# Patient Record
Sex: Male | Born: 1970 | Race: White | Hispanic: No | Marital: Married | Smoking: Never smoker
Health system: Southern US, Community
[De-identification: ages and names within clinical notes are randomized; demographics above are authoritative.]

## PROBLEM LIST (undated history)

## (undated) DIAGNOSIS — D72819 Decreased white blood cell count, unspecified: Secondary | ICD-10-CM

## (undated) DIAGNOSIS — K219 Gastro-esophageal reflux disease without esophagitis: Secondary | ICD-10-CM

## (undated) HISTORY — DX: Decreased white blood cell count, unspecified: D72.819

## (undated) HISTORY — DX: Gastro-esophageal reflux disease without esophagitis: K21.9

---

## 2002-02-12 ENCOUNTER — Encounter: Payer: Self-pay | Admitting: Family Medicine

## 2002-02-12 ENCOUNTER — Ambulatory Visit (HOSPITAL_COMMUNITY): Admission: RE | Admit: 2002-02-12 | Discharge: 2002-02-12 | Payer: Self-pay | Admitting: Family Medicine

## 2008-10-28 ENCOUNTER — Ambulatory Visit (HOSPITAL_COMMUNITY): Admission: RE | Admit: 2008-10-28 | Discharge: 2008-10-28 | Payer: Self-pay | Admitting: Orthopaedic Surgery

## 2008-10-30 ENCOUNTER — Ambulatory Visit (HOSPITAL_COMMUNITY): Admission: RE | Admit: 2008-10-30 | Discharge: 2008-10-30 | Payer: Self-pay | Admitting: Orthopaedic Surgery

## 2008-10-30 ENCOUNTER — Encounter (INDEPENDENT_AMBULATORY_CARE_PROVIDER_SITE_OTHER): Payer: Self-pay | Admitting: Orthopaedic Surgery

## 2010-06-20 ENCOUNTER — Encounter: Payer: Self-pay | Admitting: Orthopaedic Surgery

## 2010-09-05 LAB — CBC
HCT: 41.8 % (ref 39.0–52.0)
Hemoglobin: 14.3 g/dL (ref 13.0–17.0)
Platelets: 186 10*3/uL (ref 150–400)
RBC: 4.34 MIL/uL (ref 4.22–5.81)
WBC: 4.6 10*3/uL (ref 4.0–10.5)

## 2010-10-11 NOTE — Op Note (Signed)
NAME:  Isaiah Collins, Isaiah Collins                 ACCOUNT NO.:  [id number]   MEDICAL RECORD NO.:  [id number]          PATIENT TYPE:  AMB   LOCATION:  SDS                          FACILITY:  [facility]   PHYSICIAN:  Vanita Panda. Magnus Ivan, M.D.DATE OF BIRTH:  12-Mar-1971   DATE OF PROCEDURE:  10/30/2008  DATE OF DISCHARGE:  10/30/2008                               OPERATIVE REPORT   PREOPERATIVE DIAGNOSIS:  Right arm masses x2 and right axillary mass.   POSTOPERATIVE DIAGNOSIS:  Right arm masses x2 and right axillary mass.   PROCEDURE:  Excision of right arm mass x2 through two separate  incisions.   SURGEON:  Vanita Panda. Magnus Ivan, MD   ANESTHESIA:  General.   ANTIBIOTICS:  1 g IV Ancef.   BLOOD LOSS:  Less than 100 mL.   COMPLICATIONS:  None.   FINDINGS:  1. Two specimens from right arm sent to Pathology for permanent      identification.  2. Frozen section sent from OR consistent with lymphoid type of tissue      with neutrophils.   INDICATIONS:  Briefly, Isaiah Collins is a 40 year old right-hand-dominant male  who had developed 2 palpable masses and it did became painful in his  right arm over the last 2 weeks.  He also had a palpable mass in his  axilla and all these were painful.  An MRI was obtained of just the arm  with these being down toward the elbow and just proximal to the elbow.  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  I given the MRI  findings recommended he undergo exploration of the masses in his arm  with removal of these for pathologic identification.  I talked him about  the possibility of removing the axillary mass  depending on what my  findings are in the arm.  The risks and benefits of this were explained  in length, and he wished to proceed with surgery.   PROCEDURE IN DETAIL:  After informed consent was obtained, appropriate  right arm was marked.  He was brought to the operating room, placed  supine on the operating table.  General anesthesia was then obtained.  I  then had his axillary area shaved and then it was prepped from the  axilla and chest all the way down to the wrist with DuraPrep and sterile  drapes were applied including sterile stockinette.  A time-out was  called to identify the correct patient and correct right arm.  I then  made an incision directly over the smaller of the masses at the distal  third of the humerus.  I dissected down the soft tissues and easily  identified a large nerve coming right by this palpable mass.  It  was  difficult to tone, I followed this, it came off of a branch from the  nerve but it also seemed more consistent with the lymphatic chain.  I  was able to excise this mass easily in its entirety and I put it aside  for permanent identification.  I then copiously irrigated this wound and  packed it with dressing and moved down more distal to the elbow.  The  next mass was on the anterior medial elbow, I was able to dissect over  this and dissect down.  I found the tissue bunched up more in this area.  I could identify the musculocutaneous and median nerves in this area.  This was all superficial to the ulnar nerve.  However, this mass was not  as formed or is not as encapsulated as the other mass and it had  necrotic central portions.  I immediately sent these portions to  Pathology for frozen section.  The pathologist called during case and  said that he felt these findings were consistent with lymphocytes as  well as neutrophils.  This points to a potential inflammatory process or  infectious process or even a lymphoma.  I then was able to completely   dissect out this next mass in its entirety, and I passed this off the  table as well for permanent identification.  Given the fact that these  were likely lymphatic in nature, I decided to not pursue dissection and  exploration of the axillary mass.  This seemed to be more consistent  with a lymph node as well.  I then copiously irrigated again the tissue  in both wounds and closed the superficial tissue with interrupted 2-0  Vicryl suture and the skin with interrupted 3-0 nylon suture.  The  incisions were infiltrated with 0.25% plain Marcaine and then Xeroform  followed by a well-padded sterile dressing was applied.  The patient was  awakened, extubated, and taken to recovery room in stable condition.  Postoperatively, I will have him keep this incision clean for the next  few days and then we will wait the final pathology results.      Vanita Panda. Magnus Ivan, M.D.  Electronically Signed     CYB/MEDQ  D:  10/30/2008  T:  10/31/2008  Job:  [id number]

## 2014-06-26 ENCOUNTER — Telehealth: Payer: Self-pay | Admitting: Hematology

## 2014-06-26 NOTE — Telephone Encounter (Signed)
CALLED PATIENT TO SCHEDULE NP APPT PER PATIENT WILL CALL BACK TO SCHEDULE.

## 2014-07-15 ENCOUNTER — Telehealth: Payer: Self-pay | Admitting: Hematology and Oncology

## 2014-07-15 NOTE — Telephone Encounter (Signed)
pt confirmed appt on 08/04/14 w/Gorsuch Dx: tender neck adenopathy- intermittent, longstanding Referring Dr. Dulce Sellarutlaw [facility])

## 2014-08-04 ENCOUNTER — Ambulatory Visit: Payer: 59

## 2014-08-04 ENCOUNTER — Encounter: Payer: Self-pay | Admitting: Hematology and Oncology

## 2014-08-04 ENCOUNTER — Encounter (INDEPENDENT_AMBULATORY_CARE_PROVIDER_SITE_OTHER): Payer: Self-pay

## 2014-08-04 ENCOUNTER — Ambulatory Visit (HOSPITAL_BASED_OUTPATIENT_CLINIC_OR_DEPARTMENT_OTHER): Payer: 59 | Admitting: Hematology and Oncology

## 2014-08-04 VITALS — BP 141/66 | HR 79 | Temp 98.3°F | Resp 20 | Ht 75.0 in | Wt 184.5 lb

## 2014-08-04 DIAGNOSIS — D7589 Other specified diseases of blood and blood-forming organs: Secondary | ICD-10-CM

## 2014-08-04 DIAGNOSIS — D72819 Decreased white blood cell count, unspecified: Secondary | ICD-10-CM

## 2014-08-04 DIAGNOSIS — R194 Change in bowel habit: Secondary | ICD-10-CM

## 2014-08-04 NOTE — Progress Notes (Signed)
Checked in new pt with no financial concerns at this time.  Pt has Raquel's card for any billing questions or concerns. ° °

## 2014-08-05 DIAGNOSIS — D7589 Other specified diseases of blood and blood-forming organs: Secondary | ICD-10-CM | POA: Insufficient documentation

## 2014-08-05 DIAGNOSIS — R194 Change in bowel habit: Secondary | ICD-10-CM | POA: Insufficient documentation

## 2014-08-05 DIAGNOSIS — D72819 Decreased white blood cell count, unspecified: Secondary | ICD-10-CM | POA: Insufficient documentation

## 2014-08-05 NOTE — Assessment & Plan Note (Signed)
He has mild macrocytosis without anemia. I suspect this is related to his alcohol intake. I reassured the patient.

## 2014-08-05 NOTE — Assessment & Plan Note (Signed)
He had tender lymphadenopathy of month or 2 ago, subsequently resolved. He was mild leukopenia which has improved on recent blood work. Overall, I do not believe this is related to malignancy. It is very likely the patient may have contracted viral illness over the past few months and overall he is improving. I recommend he have repeat blood work with his PCP within the next 2-3 months and have the results faxed to me. At present time, the patient is comfortable not to pursue additional workup.

## 2014-08-05 NOTE — Assessment & Plan Note (Signed)
This is of concern. Again, I told the patient bowel habit could change after viral illness. He has appointment set up in the near future to see a gastroenterologist for EGD and colonoscopy. I would defer to them for further management. I recommend the patient to avoid dairy products if possible.

## 2014-08-05 NOTE — Progress Notes (Signed)
[facility] CONSULT NOTE  Patient Care Team: No Pcp Per Patient as PCP - General (General Practice)  CHIEF COMPLAINTS/PURPOSE OF CONSULTATION:  Tender lymphadenopathy, mild leukopenia  HISTORY OF PRESENTING ILLNESS:  Isaiah Collins 44 y.o. male is here because of recent tender lymphadenopathy and mild leukopenia. I reviewed the last few blood test dated back from 2013 and noted some mild leukopenia and macrocytosis without anemia. The patient started to have weird sensation around the left cervical region starting around October 2015. He had tender lymphadenopathy which has resolved. He also complained of some jaw pain and earache. He has 2 young children at home and they were sick around the time. Subsequently, he started to have change in bowel habits with constipation alternating with diarrhea with associated gas pain. He had one episode of hematochezia 3 weeks ago. He has mild symptoms of reflux sensation on and off. He denies recent weight loss.   MEDICAL HISTORY:  Past Medical History  Diagnosis Date  . GERD (gastroesophageal reflux disease)   . Leukopenia     SURGICAL HISTORY: History reviewed. No pertinent past surgical history.  SOCIAL HISTORY: History   Social History  . Marital Status: Single    Spouse Name: N/A  . Number of Children: N/A  . Years of Education: N/A   Occupational History  . Not on file.   Social History Main Topics  . Smoking status: Never Smoker   . Smokeless tobacco: Never Used  . Alcohol Use: Yes     Comment: wine - 2-3 glasses each night  . Drug Use: No  . Sexual Activity: Not on file   Other Topics Concern  . Not on file   Social History Narrative  . No narrative on file    FAMILY HISTORY: History reviewed. No pertinent family history.  ALLERGIES:  has No Known Allergies.  MEDICATIONS:  Current Outpatient Prescriptions  Medication Sig Dispense Refill  . esomeprazole (NEXIUM) 20 MG capsule Take 20 mg by mouth  daily.     . fluticasone (FLONASE) 50 MCG/ACT nasal spray   1   No current facility-administered medications for this visit.    REVIEW OF SYSTEMS:   Constitutional: Denies fevers, chills or abnormal night sweats Eyes: Denies blurriness of vision, double vision or watery eyes Ears, nose, mouth, throat, and face: Denies mucositis or sore throat Respiratory: Denies cough, dyspnea or wheezes Cardiovascular: Denies palpitation, chest discomfort or lower extremity swelling Skin: Denies abnormal skin rashes Neurological:Denies numbness, tingling or new weaknesses Behavioral/Psych: Mood is stable, no new changes  All other systems were reviewed with the patient and are negative.  PHYSICAL EXAMINATION: ECOG PERFORMANCE STATUS: 0 - Asymptomatic  Filed Vitals:   08/04/14 1415  BP: 141/66  Pulse: 79  Temp: 98.3 F (36.8 C)  Resp: 20   Filed Weights   08/04/14 1415  Weight: 184 lb 8 oz (83.689 kg)    GENERAL:alert, no distress and comfortable SKIN: skin color, texture, turgor are normal, no rashes or significant lesions EYES: normal, conjunctiva are pink and non-injected, sclera clear OROPHARYNX:no exudate, no erythema and lips, buccal mucosa, and tongue normal  NECK: supple, thyroid normal size, non-tender, without nodularity LYMPH:  no palpable lymphadenopathy in the cervical, axillary or inguinal LUNGS: clear to auscultation and percussion with normal breathing effort HEART: regular rate & rhythm and no murmurs and no lower extremity edema ABDOMEN:abdomen soft, non-tender and normal bowel sounds Musculoskeletal:no cyanosis of digits and no clubbing  PSYCH: alert & oriented x  3 with fluent speech NEURO: no focal motor/sensory deficits  LABORATORY DATA:  I have reviewed the data as listed Lab Results  Component Value Date   WBC 4.6 10/30/2008   HGB 14.3 10/30/2008   HCT 41.8 10/30/2008   MCV 96.4 10/30/2008   PLT 186 10/30/2008   No results for input(s): NA, K, CL, CO2,  GLUCOSE, BUN, CREATININE, CALCIUM, GFRNONAA, GFRAA, PROT, ALBUMIN, AST, ALT, ALKPHOS, BILITOT, BILIDIR, IBILI in the last 8760 hours. ASSESSMENT & PLAN:  Chronic leukopenia He had tender lymphadenopathy of month or 2 ago, subsequently resolved. He was mild leukopenia which has improved on recent blood work. Overall, I do not believe this is related to malignancy. It is very likely the patient may have contracted viral illness over the past few months and overall he is improving. I recommend he have repeat blood work with his PCP within the next 2-3 months and have the results faxed to me. At present time, the patient is comfortable not to pursue additional workup.   Change in bowel habits This is of concern. Again, I told the patient bowel habit could change after viral illness. He has appointment set up in the near future to see a gastroenterologist for EGD and colonoscopy. I would defer to them for further management. I recommend the patient to avoid dairy products if possible.   Macrocytosis without anemia He has mild macrocytosis without anemia. I suspect this is related to his alcohol intake. I reassured the patient.      All questions were answered. The patient knows to call the clinic with any problems, questions or concerns. I spent 30 minutes counseling the patient face to face. The total time spent in the appointment was 40 minutes and more than 50% was on counseling.     [facility] Hospital[facility]ORSUCH, Brennyn Haisley, MD 08/05/2014 9:31 AM

## 2016-01-30 ENCOUNTER — Emergency Department (HOSPITAL_COMMUNITY): Payer: 59

## 2016-01-30 ENCOUNTER — Emergency Department (HOSPITAL_COMMUNITY)
Admission: EM | Admit: 2016-01-30 | Discharge: 2016-01-30 | Disposition: A | Discharge status: Home / Self Care | Payer: 59 | Attending: Emergency Medicine | Admitting: Emergency Medicine

## 2016-01-30 DIAGNOSIS — R112 Nausea with vomiting, unspecified: Secondary | ICD-10-CM | POA: Diagnosis not present

## 2016-01-30 DIAGNOSIS — R197 Diarrhea, unspecified: Secondary | ICD-10-CM | POA: Diagnosis not present

## 2016-01-30 DIAGNOSIS — R1013 Epigastric pain: Secondary | ICD-10-CM | POA: Insufficient documentation

## 2016-01-30 DIAGNOSIS — Z79899 Other long term (current) drug therapy: Secondary | ICD-10-CM | POA: Insufficient documentation

## 2016-01-30 DIAGNOSIS — R1012 Left upper quadrant pain: Secondary | ICD-10-CM | POA: Diagnosis present

## 2016-01-30 LAB — I-STAT CG4 LACTIC ACID, ED: LACTIC ACID, VENOUS: 0.73 mmol/L (ref 0.5–1.9)

## 2016-01-30 LAB — CBC
HCT: 40.9 % (ref 39.0–52.0)
Hemoglobin: 14.6 g/dL (ref 13.0–17.0)
MCH: 32.8 pg (ref 26.0–34.0)
MCHC: 35.7 g/dL (ref 30.0–36.0)
MCV: 91.9 fL (ref 78.0–100.0)
Platelets: 172 10*3/uL (ref 150–400)
RBC: 4.45 MIL/uL (ref 4.22–5.81)
RDW: 12.8 % (ref 11.5–15.5)
WBC: 5.8 10*3/uL (ref 4.0–10.5)

## 2016-01-30 LAB — URINALYSIS, ROUTINE W REFLEX MICROSCOPIC
BILIRUBIN URINE: NEGATIVE
Glucose, UA: NEGATIVE mg/dL
Hgb urine dipstick: NEGATIVE
KETONES UR: 15 mg/dL — AB
LEUKOCYTES UA: NEGATIVE
NITRITE: NEGATIVE
PROTEIN: NEGATIVE mg/dL
Specific Gravity, Urine: 1.018 (ref 1.005–1.030)
pH: 5.5 (ref 5.0–8.0)

## 2016-01-30 LAB — COMPREHENSIVE METABOLIC PANEL
ALBUMIN: 4.8 g/dL (ref 3.5–5.0)
ALK PHOS: 28 U/L — AB (ref 38–126)
ALT: 42 U/L (ref 17–63)
ANION GAP: 8 (ref 5–15)
AST: 28 U/L (ref 15–41)
BILIRUBIN TOTAL: 0.7 mg/dL (ref 0.3–1.2)
BUN: 22 mg/dL — AB (ref 6–20)
CALCIUM: 9.5 mg/dL (ref 8.9–10.3)
CO2: 30 mmol/L (ref 22–32)
Chloride: 103 mmol/L (ref 101–111)
Creatinine, Ser: 0.87 mg/dL (ref 0.61–1.24)
GFR calc Af Amer: >60 mL/min (ref >60)
GLUCOSE: 98 mg/dL (ref 65–99)
POTASSIUM: 4 mmol/L (ref 3.5–5.1)
Sodium: 141 mmol/L (ref 135–145)
TOTAL PROTEIN: 6.9 g/dL (ref 6.5–8.1)

## 2016-01-30 LAB — LIPASE, BLOOD: Lipase: 22 U/L (ref 11–51)

## 2016-01-30 MED ORDER — METRONIDAZOLE 500 MG PO TABS
500.0000 mg | ORAL_TABLET | Freq: Once | ORAL | Status: AC
  Administered 2016-01-30: 500 mg via ORAL
  Filled 2016-01-30: qty 1

## 2016-01-30 MED ORDER — DICYCLOMINE HCL 10 MG PO CAPS
10.0000 mg | ORAL_CAPSULE | Freq: Once | ORAL | Status: AC
  Administered 2016-01-30: 10 mg via ORAL
  Filled 2016-01-30: qty 1

## 2016-01-30 MED ORDER — IOPAMIDOL (ISOVUE-300) INJECTION 61%
25.0000 mL | Freq: Once | INTRAVENOUS | Status: AC | PRN
  Administered 2016-01-30: 30 mL via ORAL

## 2016-01-30 MED ORDER — ACETAMINOPHEN 325 MG PO TABS
325.0000 mg | ORAL_TABLET | Freq: Once | ORAL | Status: AC
  Administered 2016-01-30: 325 mg via ORAL
  Filled 2016-01-30: qty 1

## 2016-01-30 MED ORDER — AZITHROMYCIN 500 MG PO TABS: 500.0000 mg | ORAL_TABLET | Freq: Every day | ORAL | 0 refills | Status: DC

## 2016-01-30 MED ORDER — METRONIDAZOLE 500 MG PO TABS: 500.0000 mg | ORAL_TABLET | Freq: Two times a day (BID) | ORAL | 0 refills | Status: DC

## 2016-01-30 MED ORDER — ACETAMINOPHEN 325 MG PO TABS
650.0000 mg | ORAL_TABLET | Freq: Once | ORAL | Status: AC
  Administered 2016-01-30: 650 mg via ORAL
  Filled 2016-01-30: qty 2

## 2016-01-30 MED ORDER — IOPAMIDOL (ISOVUE-300) INJECTION 61%
100.0000 mL | Freq: Once | INTRAVENOUS | Status: AC | PRN
  Administered 2016-01-30: 100 mL via INTRAVENOUS

## 2016-01-30 MED ORDER — SODIUM CHLORIDE 0.9 % IV BOLUS (SEPSIS)
1000.0000 mL | Freq: Once | INTRAVENOUS | Status: AC
  Administered 2016-01-30: 1000 mL via INTRAVENOUS

## 2016-01-30 MED ORDER — ONDANSETRON HCL 4 MG PO TABS: 4.0000 mg | ORAL_TABLET | Freq: Four times a day (QID) | ORAL | 0 refills | Status: DC

## 2016-01-30 MED ORDER — ONDANSETRON HCL 4 MG/2ML IJ SOLN
4.0000 mg | Freq: Once | INTRAMUSCULAR | Status: AC
  Administered 2016-01-30: 4 mg via INTRAVENOUS
  Filled 2016-01-30: qty 2

## 2016-01-30 MED ORDER — GI COCKTAIL ~~LOC~~
30.0000 mL | Freq: Once | ORAL | Status: AC
Start: 2016-01-30 — End: 2016-01-30
  Administered 2016-01-30: 30 mL via ORAL
  Filled 2016-01-30: qty 30

## 2016-01-30 NOTE — ED Triage Notes (Addendum)
Pt states that he has been on a strict diet x 3 weeks trying to fix his small bowel bacteria issues but today his abdominal pain has become unbearable. Pt also states that he was bitten by 3-5 ticks this year, the last being 2 days ago. Alert and oriented. Nausea.

## 2016-01-30 NOTE — Discharge Instructions (Signed)
Take the flagyl and azithromycin as prescribed. May take the zofran as needed for nausea. Follow with your PCP if symptoms worsen. Follow up with GI and have your colonoscopy/endoscopy scheduled for Oct. Do not consume alcohol while taking the flagyl.

## 2016-01-30 NOTE — ED Provider Notes (Signed)
[facility]-EMERGENCY DEPT Provider Note   CSN: [id number] Arrival date & time: 01/30/16  1513     History   Chief Complaint Chief Complaint  Patient presents with  . Abdominal Pain    HPI Isaiah Collins is a 45 y.o. male.  45 year old Caucasian male no significant past medical history presents to the ED this afternoon with abdominal pain. Patient states the pain is located in his left upper abdomen and epigastric area. Describes the pain as cramping. This is a chronic issue. Started approximately 2 years ago. However the severity increased today. Which is why he presented to ED. Nothing makes better or worse. Has tried nothing for the pain. Patient was seen by a naturopathic provided and started a strict natural diet. He is followed up with GI who has a colonoscopy anf endoscopy scheduled for the beginning of October. Patient also endorses being bloated. Excessive burping. Endorses loose bowel habits. Was seen by primary care doctor August 30.  Patient also endorses being bitten by 3-5 ticks this year. Denies any rashes, fevers. Denies any fever, chills, headache, vision changes, emesis, chest pain, shortness of breath, urinary symptoms, hematochezia, melena,. Patient states abdominal pain is worse after bowel movement.  Nothing makes better or worse. He has tried nothing for the pain. Endorses mild nausea no emesis. Denies any family history of of auto immune disorders. Patient states they drink well water.      Past Medical History:  Diagnosis Date  . GERD (gastroesophageal reflux disease)   . Leukopenia     Patient Active Problem List   Diagnosis Date Noted  . Chronic leukopenia 08/05/2014  . Macrocytosis without anemia 08/05/2014  . Change in bowel habits 08/05/2014    No past surgical history on file.     Home Medications    Prior to Admission medications   Medication Sig Start Date End Date Taking? Authorizing Provider  esomeprazole (NEXIUM) 20 MG capsule Take 20 mg by  mouth daily.  06/14/14 06/14/15  Historical Provider, MD  fluticasone Aleda Grana) 50 MCG/ACT nasal spray  06/14/14   Historical Provider, MD    Family History No family history on file.  Social History Social History  Substance Use Topics  . Smoking status: Never Smoker  . Smokeless tobacco: Never Used  . Alcohol use Yes     Comment: wine - 2-3 glasses each night     Allergies   Review of patient's allergies indicates no known allergies.   Review of Systems Review of Systems  Constitutional: Positive for appetite change. Negative for chills, fever and unexpected weight change.  HENT: Negative for congestion, ear pain, rhinorrhea and sore throat.   Eyes: Negative for pain and discharge.  Respiratory: Negative for cough and shortness of breath.   Cardiovascular: Negative for chest pain and palpitations.  Gastrointestinal: Negative for abdominal distention, abdominal pain, blood in stool, constipation, diarrhea, nausea and vomiting.  Genitourinary: Negative for dysuria, flank pain, frequency, hematuria and urgency.  Musculoskeletal: Negative for arthralgias, myalgias and neck pain.  Skin: Negative for color change and rash.  Neurological: Negative for dizziness, syncope, weakness, light-headedness, numbness and headaches.  All other systems reviewed and are negative.    Physical Exam Updated Vital Signs BP 117/74 (BP Location: Left Arm)   Pulse 91   Temp 99.5 F (37.5 C) (Oral)   Resp 18   SpO2 100%   Physical Exam  Constitutional: He appears well-developed and well-nourished. No distress.  HENT:  Head: Normocephalic and atraumatic.  Mouth/Throat: Oropharynx  is clear and moist.  Eyes: Conjunctivae are normal. Right eye exhibits no discharge. Left eye exhibits no discharge. No scleral icterus.  Neck: Normal range of motion. Neck supple. No thyromegaly present.  Cardiovascular: Regular rhythm, normal heart sounds and intact distal pulses.  Tachycardia present.     Pulmonary/Chest: Effort normal and breath sounds normal. No respiratory distress.  Abdominal: Soft. Bowel sounds are normal. He exhibits no distension. There is generalized tenderness and tenderness in the epigastric area and left upper quadrant. There is no rigidity, no rebound, no guarding, no CVA tenderness, no tenderness at McBurney's point and negative Murphy's sign.  Musculoskeletal: Normal range of motion.  Lymphadenopathy:    He has no cervical adenopathy.  Neurological: He is alert.  Skin: Skin is warm and dry. Capillary refill takes less than 2 seconds. No rash noted. No pallor.  Vitals reviewed.    ED Treatments / Results  Labs (all labs ordered are listed, but only abnormal results are displayed) Labs Reviewed  COMPREHENSIVE METABOLIC PANEL - Abnormal; Notable for the following:       Result Value   BUN 22 (*)    Alkaline Phosphatase 28 (*)    All other components within normal limits  URINALYSIS, ROUTINE W REFLEX MICROSCOPIC (NOT AT [facility]) - Abnormal; Notable for the following:    Ketones, ur 15 (*)    All other components within normal limits  LIPASE, BLOOD  CBC  I-STAT CG4 LACTIC ACID, ED    EKG  EKG Interpretation None       Radiology Ct Abdomen Pelvis W Contrast  Result Date: 01/30/2016 CLINICAL DATA:  45 year old male presenting with abdominal pain. EXAM: CT ABDOMEN AND PELVIS WITH CONTRAST TECHNIQUE: Multidetector CT imaging of the abdomen and pelvis was performed using the standard protocol following bolus administration of intravenous contrast. CONTRAST:  ISOVUE-300 IOPAMIDOL (ISOVUE-300) INJECTION 61%, 25mL ISOVUE-300 IOPAMIDOL (ISOVUE-300) INJECTION 61% COMPARISON:  Lumbar spine radiograph dated 02/02/2012 FINDINGS: The visualized lung bases are clear. No intra-abdominal free air. Trace free fluid within the pelvis. The liver, gallbladder, pancreas, spleen, adrenal glands, kidneys, visualized ureters, and urinary bladder appear unremarkable. The  prostate and seminal vesicles grossly unremarkable. Evaluation of the bowel is limited in the absence of oral contrast. There is no evidence of bowel obstruction. The stomach is distended with gastric contents without definite evidence of mechanical gastric outlet obstruction. Clinical correlation is recommended to evaluate for gastroparesis. Multiple nondilated fluid-filled loops of small bowel noted. Enteritis is not excluded. Correlation with clinical exam recommended. The distal colon is collapsed. The appendix appears unremarkable. The abdominal aorta and IVC appear unremarkable. The origins of the celiac axis, SMA, IMA as well as the origins of the renal arteries are patent. The splenic vein, SMV, and main portal vein are patent. No portal venous gas identified. There is no adenopathy. The abdominal wall soft tissues appear unremarkable. The osseous structures are intact. IMPRESSION: Distended stomach without definite evidence of mechanical gastric outlet obstruction. Gastroparesis is not excluded. Nondistended fluid-filled loops of small bowel with mild haziness of the bowel wall concerning for enteritis. Clinical correlation is recommended. No evidence of small-bowel obstruction. Normal appendix. Electronically Signed   By: Elgie Collard M.D.   On: 01/30/2016 20:03    Procedures Procedures (including critical care time)  Medications Ordered in ED Medications  ondansetron (ZOFRAN) injection 4 mg (not administered)  gi cocktail (Maalox,Lidocaine,Donnatal) (not administered)     Initial Impression / Assessment and Plan / ED Course  I have  reviewed the triage vital signs and the nursing notes.  Pertinent labs & imaging results that were available during my care of the patient were reviewed by me and considered in my medical decision making (see chart for details).  Clinical Course  Patient presents to the ED with epigastric pain and bloating that has been ongoing for 2 years. Labs were  unremarkable. Patient was mildly tachycardic in ED. 1L of fluid bolus given. HR improved. Exam showed mild epigastric discomfort. No signs of peritonitis. CT scan showed dilated stomach without evidence of obstruction. It also noted he had enteritis.No signs of appendicitis or cholecystitis.  Patient started to develop a fever while in the ED. Lactic acid was done and normal. Another liter of IV fluids given. Tachycardia improved. Tylenol given for fever. Fever improved on discharge. Patient treated for possible bacterial enteritis with azithromycin due to patient having cipro allergy. Patient states he drinks well water and reports diarrhea. Will treat with flagyl for possible Giardia infection. Zofran given for nausea. Gi cocktail given. Patient states improvement with epigastric pain. Encouraged patient to follow up for coloscopy and endoscopy scheduled for beginning of October. Follow up with PCP if symptoms worsen. And patient encouraged to follow up with GI. Patient verbalized understanding with plan of care. Dr. Wynona Mealsarbana saw and examined patient and agrees with plan of care. Hemodynamically stable. Discharged home in NAD with stable vs and strict return precautions.    Final Clinical Impressions(s) / ED Diagnoses   Final diagnoses:  Epigastric pain  Nausea and vomiting, vomiting of unspecified type  Diarrhea, unspecified type    New Prescriptions Discharge Medication List as of 01/30/2016 10:33 PM    START taking these medications   Details  azithromycin (ZITHROMAX) 500 MG tablet Take 1 tablet (500 mg total) by mouth daily., Starting Sun 01/30/2016, Print    metroNIDAZOLE (FLAGYL) 500 MG tablet Take 1 tablet (500 mg total) by mouth 2 (two) times daily with a meal. DO NOT CONSUME ALCOHOL WHILE TAKING THIS MEDICATION., Starting Sun 01/30/2016, Print    ondansetron (ZOFRAN) 4 MG tablet Take 1 tablet (4 mg total) by mouth every 6 (six) hours., Starting Sun 01/30/2016, Print         Rise MuKenneth T  Price Lachapelle, PA-C 02/01/16 [id number]

## 2016-01-30 NOTE — ED Provider Notes (Signed)
Medical screening examination/treatment/procedure(s) were conducted as a shared visit with non-physician practitioner(s) and myself.  I personally evaluated the patient during the encounter. Briefly, the patient is a 45 y.o. male presents with several years a recurrence abdominal pain/bloating. Here due to worsening symptoms. Patient has tried naturopathic remedies without relief. Abdomen with mild discomfort to palpation of the periumbilical/epigastric regions without evidence of peritonitis. Labs reassuring. CT scan revealed dilated stomach without evidence of outlet obstruction; also noted to have enteritis. Patient declined any personal family history of autoimmune disorders. They report that they drink well water. We will treat patient for bacterial and possible Giardia infection with azithromycin and Flagyl due to patient's Cipro allergy.   EKG Interpretation None           Isaiah ConnPedro Eduardo Cardama, MD 01/31/16 0200

## 2017-10-08 IMAGING — CT CT ABD-PELV W/ CM
2 of 5 series · 16 of 46 positions shown, 18 images · IV contrast (ISOVUE)
Comparison: Lumbar spine radiograph dated 02/02/2012

CLINICAL DATA: 45-year-old male presenting with abdominal pain.

EXAM:
CT ABDOMEN AND PELVIS WITH CONTRAST
TECHNIQUE: Multidetector CT imaging of the abdomen and pelvis was performed
using the standard protocol following bolus administration of
intravenous contrast.
CONTRAST:  100mL 66TW1E-1HH IOPAMIDOL (66TW1E-1HH) INJECTION 61%,
25mL 66TW1E-1HH IOPAMIDOL (66TW1E-1HH) INJECTION 61%

[Series 2: abd/pel with · axial · 0.80mm/px · z∈[-118,+298]mm · 13 of 95 slices shown, 15 images]
[im 6/95  soft-tissue]
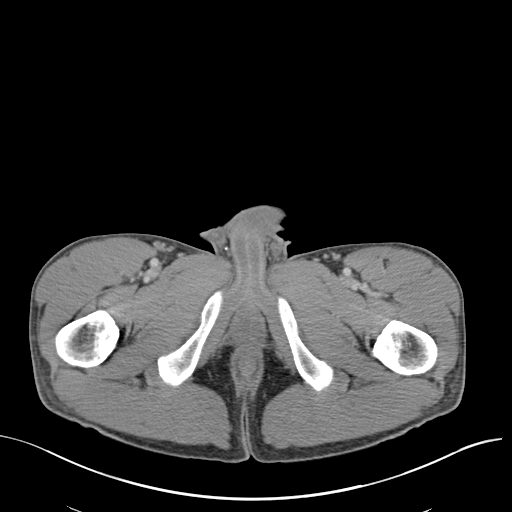
[im 6/95  bone]
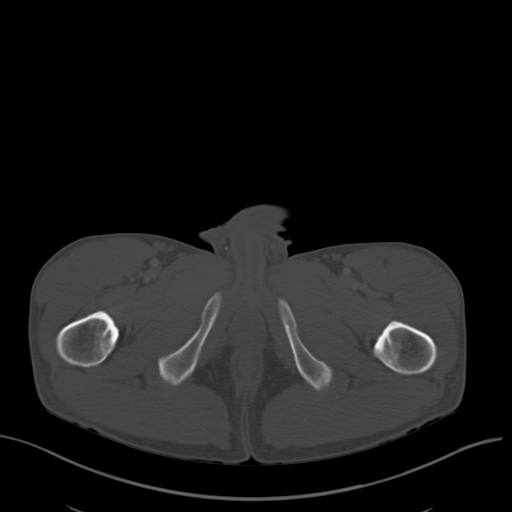
[im 12/95  soft-tissue]
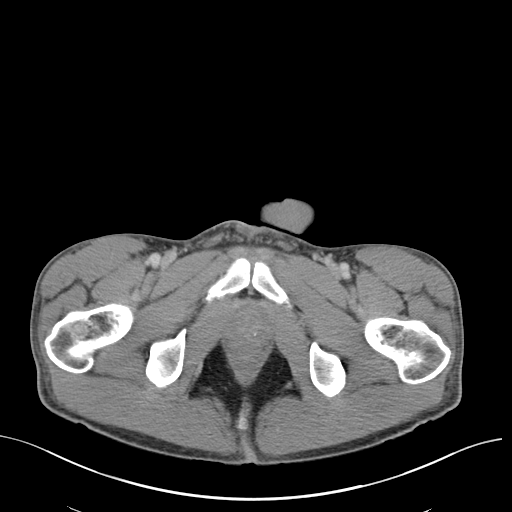
[im 18/95  soft-tissue]
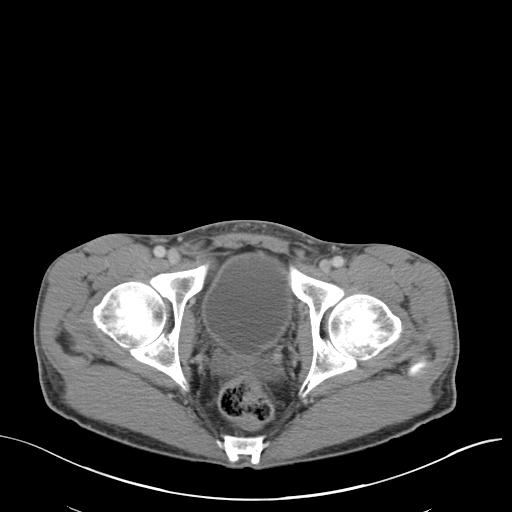
[im 30/95  soft-tissue]
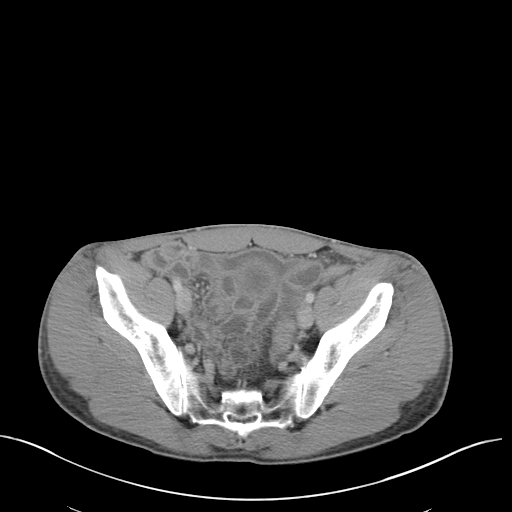
[im 36/95  soft-tissue]
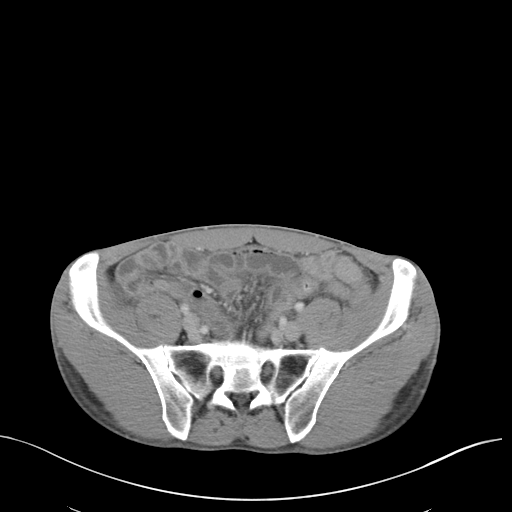
[im 42/95  soft-tissue]
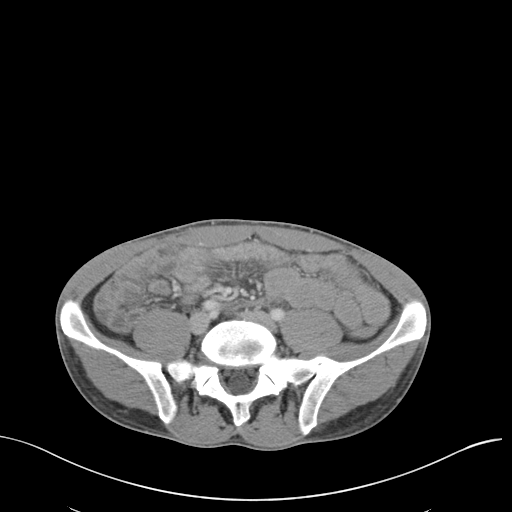
[im 48/95  soft-tissue]
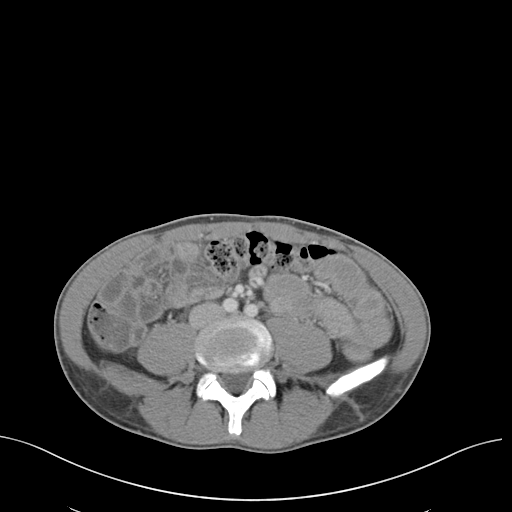
[im 53/95  soft-tissue]
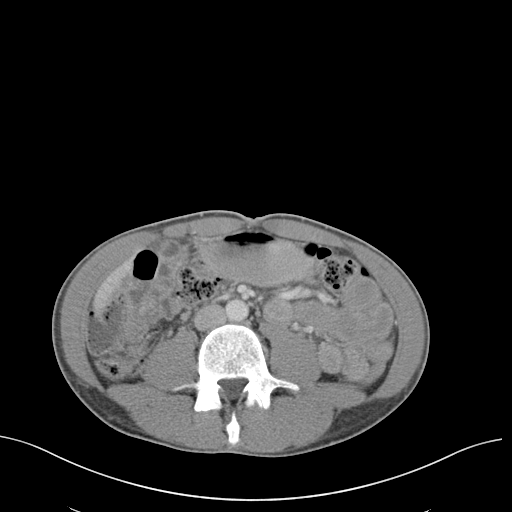
[im 59/95  soft-tissue]
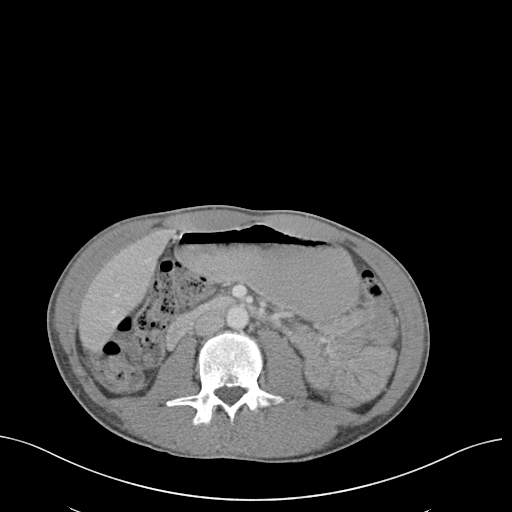
[im 59/95  bone]
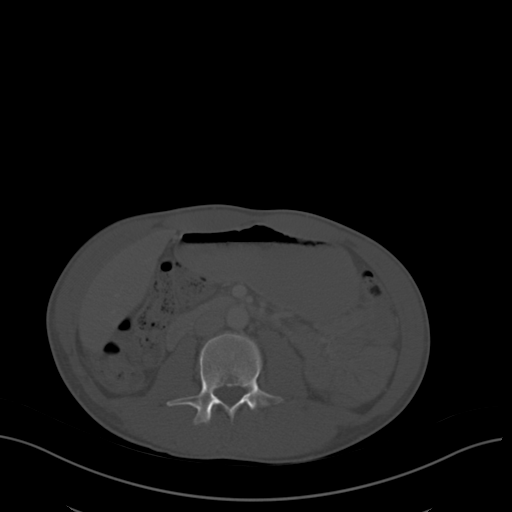
[im 65/95  soft-tissue]
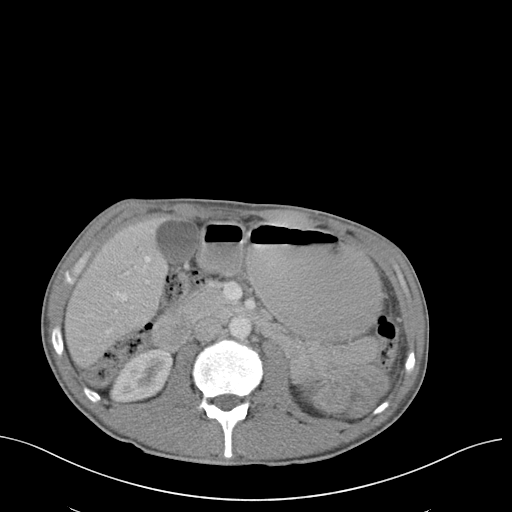
[im 77/95  soft-tissue]
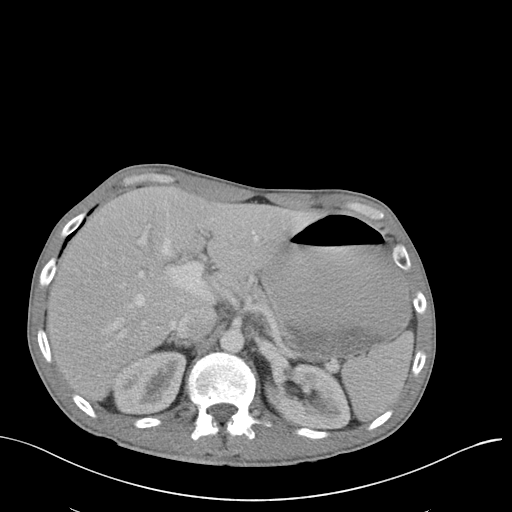
[im 83/95  soft-tissue]
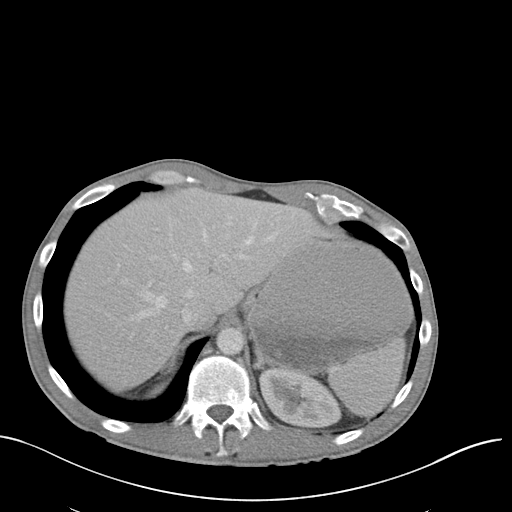
[im 89/95  soft-tissue]
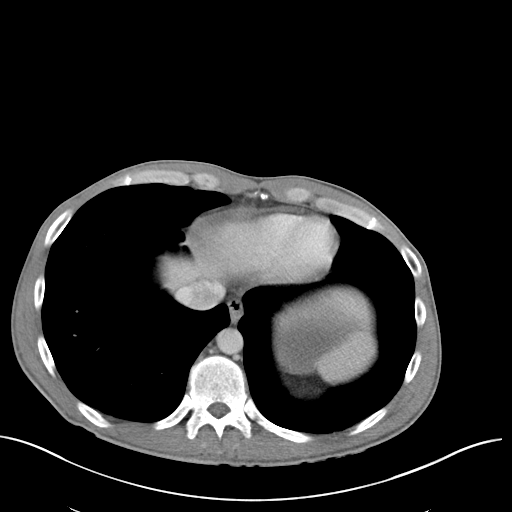

[Series 4: coronal a/|p · coronal · 0.74mm/px · 3 of 118 slices shown]
[im 40/118  soft-tissue]
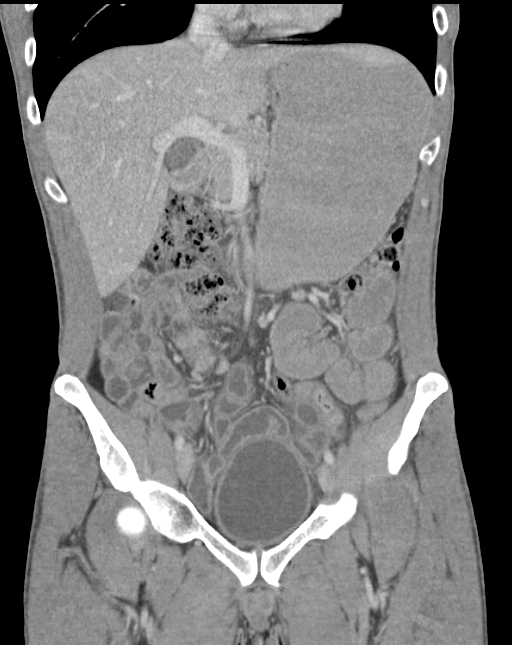
[im 53/118  soft-tissue]
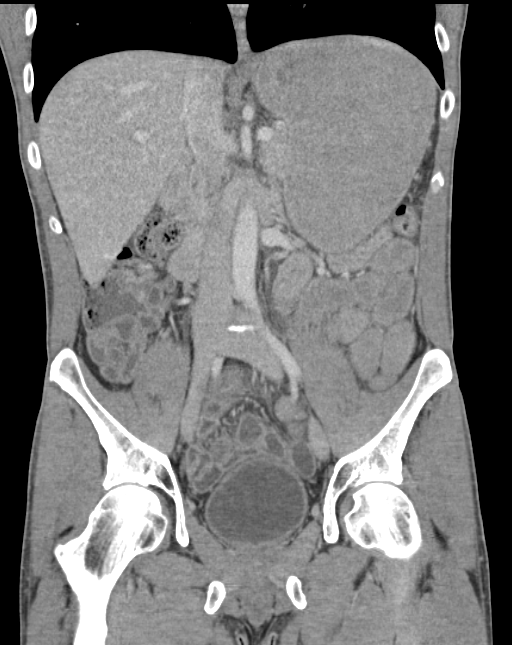
[im 66/118  soft-tissue]
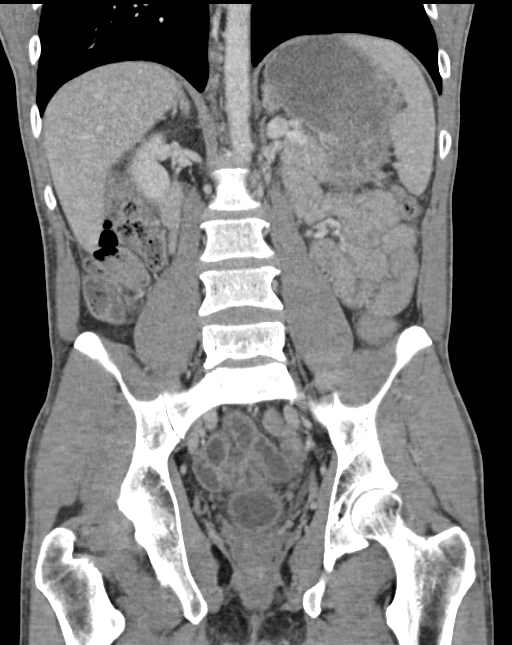

[16 of 46 positions shown; findings below may reference images not displayed]

FINDINGS: The visualized lung bases are clear. No intra-abdominal free air.
Trace free fluid within the pelvis.

The liver, gallbladder, pancreas, spleen, adrenal glands, kidneys,
visualized ureters, and urinary bladder appear unremarkable. The
prostate and seminal vesicles grossly unremarkable.

Evaluation of the bowel is limited in the absence of oral contrast.
There is no evidence of bowel obstruction. The stomach is distended
with gastric contents without definite evidence of mechanical
gastric outlet obstruction. Clinical correlation is recommended to
evaluate for gastroparesis. Multiple nondilated fluid-filled loops
of small bowel noted. Enteritis is not excluded. Correlation with
clinical exam recommended. The distal colon is collapsed. The
appendix appears unremarkable.

The abdominal aorta and IVC appear unremarkable. The origins of the
celiac axis, SMA, IMA as well as the origins of the renal arteries
are patent. The splenic vein, SMV, and main portal vein are patent.
No portal venous gas identified. There is no adenopathy. The
abdominal wall soft tissues appear unremarkable. The osseous
structures are intact.
IMPRESSION: Distended stomach without definite evidence of mechanical gastric
outlet obstruction. Gastroparesis is not excluded.

Nondistended fluid-filled loops of small bowel with mild haziness of
the bowel wall concerning for enteritis. Clinical correlation is
recommended. No evidence of small-bowel obstruction. Normal
appendix.

## 2021-05-09 ENCOUNTER — Other Ambulatory Visit: Payer: Self-pay

## 2021-05-09 ENCOUNTER — Ambulatory Visit: Payer: Self-pay

## 2021-05-09 ENCOUNTER — Ambulatory Visit (INDEPENDENT_AMBULATORY_CARE_PROVIDER_SITE_OTHER): Payer: BC Managed Care – PPO | Admitting: Orthopaedic Surgery

## 2021-05-09 DIAGNOSIS — S92415A Nondisplaced fracture of proximal phalanx of left great toe, initial encounter for closed fracture: Secondary | ICD-10-CM

## 2021-05-09 DIAGNOSIS — M79675 Pain in left toe(s): Secondary | ICD-10-CM

## 2021-05-09 NOTE — Progress Notes (Signed)
Isaiah Collins comes in today as an acute work in for evaluation treatment of a left great toe injury.  He was running along a trail when he hit a stump.  It has been painful to put weight on his left great toe since then.  He denies any injuries to the nailbed itself.  He is otherwise very healthy individual.  He is not a smoker not a diabetic and thin.  He is very athletic.  He has had no acute medical issues.  He has had some pain at the MTP joint on occasion when running of the left great toe but no significant acute issues.  He has taken 600 mg of ibuprofen and that is helped ease the pain from this morning's injury.  Examination of his left great toe shows the nailbed is intact.  He has swelling of the mid phalanx.  There is no significant deformity other than swelling and bruising.  His MTP joint and IP joint of the great toe are intact.  X-rays of the left great toe show a proximal phalanx fracture with no significant displacement.  There is arthritic changes at the MTP joint and evidence of hallux rigidus.  He can weight-bear as tolerated and even buddy tape his great toe to the second toe if needed.  Postoperative shoe will help.  He understands that avoiding running and high impact aerobic activities for now will be the most helpful and this should heal with time.  He will just take ibuprofen as needed for pain.  I will see him back in 6 weeks with a repeat AP and lateral of the left great toe.

## 2021-06-20 ENCOUNTER — Encounter: Payer: Self-pay | Admitting: Orthopaedic Surgery

## 2021-06-20 ENCOUNTER — Ambulatory Visit: Payer: Self-pay

## 2021-06-20 ENCOUNTER — Other Ambulatory Visit: Payer: Self-pay

## 2021-06-20 ENCOUNTER — Ambulatory Visit (INDEPENDENT_AMBULATORY_CARE_PROVIDER_SITE_OTHER): Payer: BC Managed Care – PPO | Admitting: Orthopaedic Surgery

## 2021-06-20 DIAGNOSIS — M79675 Pain in left toe(s): Secondary | ICD-10-CM

## 2021-06-20 DIAGNOSIS — M25562 Pain in left knee: Secondary | ICD-10-CM | POA: Diagnosis not present

## 2021-06-20 DIAGNOSIS — G8929 Other chronic pain: Secondary | ICD-10-CM | POA: Diagnosis not present

## 2021-06-20 DIAGNOSIS — S92415A Nondisplaced fracture of proximal phalanx of left great toe, initial encounter for closed fracture: Secondary | ICD-10-CM | POA: Diagnosis not present

## 2021-06-20 NOTE — Progress Notes (Signed)
Office Visit Note   Patient: Isaiah Collins           Date of Birth: 09-04-1970           MRN: [id number] Visit Date: 06/20/2021              Requested by: Devra Dopp, MD [address] [place],  [place] [address] PCP: Devra Dopp, MD   Assessment & Plan: Visit Diagnoses:  1. Closed nondisplaced fracture of proximal phalanx of left great toe, initial encounter   2. Great toe pain, left   3. Chronic pain of left knee     Plan: From the standpoint of his left great toe fracture, he can transition back to regular shoes but I will have him avoid high impact aerobic activities for at least 4 more weeks.  From a knee standpoint, I have recommended a MRI if things worsen for him at all.  All questions and concerns were answered and addressed.  Follow-up is as needed.  Follow-Up Instructions: Return if symptoms worsen or fail to improve.   Orders:  Orders Placed This Encounter  Procedures   XR Toe Great Left   XR Knee 1-2 Views Left   No orders of the defined types were placed in this encounter.     Procedures: No procedures performed   Clinical Data: No additional findings.   Subjective: Chief Complaint  Patient presents with   Left Great Toe - Follow-up  The patient comes in today for follow-up as a relates to his left great toe proximal phalanx fracture.  He is 6 weeks into this injury and he says he is doing much better overall.  He has actually skied as well and the skin did not bother him except for putting on the ski boot.  He is also been dealing with some left knee issues for some time now.  He has had pain over the medial collateral ligament and some swelling in this area.  He says sometimes the knee just does not feel stable to him.  He has had no injury.  He is very athletic and an avid runner as well as participating in other sports.  He is a thin individual as well.  HPI  Review of Systems There is no fever, chills, nausea, vomiting  Objective: Vital  Signs: There were no vitals taken for this visit.  Physical Exam He is alert and orient x3 and in no acute distress Ortho Exam Examination of his left knee shows pain of the medial collateral ligament and some swelling in this area but no instability on my exam.  His McMurray's and Lachman's exams are normal and his range of motion is full.  There is no knee joint effusion.  Examination of his left great toe still shows some swelling but minimal pain on exam. Specialty Comments:  No specialty comments available.  Imaging: XR Knee 1-2 Views Left  Result Date: 06/20/2021 2 views of the left knee show no acute findings.  The joint space is well-maintained.  The alignment is neutral.  XR Toe Great Left  Result Date: 06/20/2021 2 views of the left great toe show a healing fracture of the proximal phalanx.    PMFS History: Patient Active Problem List   Diagnosis Date Noted   Chronic leukopenia 08/05/2014   Macrocytosis without anemia 08/05/2014   Change in bowel habits 08/05/2014   Past Medical History:  Diagnosis Date   GERD (gastroesophageal reflux disease)    Leukopenia  History reviewed. No pertinent family history.  History reviewed. No pertinent surgical history. Social History   Occupational History   Not on file  Tobacco Use   Smoking status: Never   Smokeless tobacco: Never  Substance and Sexual Activity   Alcohol use: Yes    Comment: wine - 2-3 glasses each night   Drug use: No   Sexual activity: Not on file

## 2021-06-23 DIAGNOSIS — Z789 Other specified health status: Secondary | ICD-10-CM | POA: Diagnosis not present

## 2021-06-23 DIAGNOSIS — F418 Other specified anxiety disorders: Secondary | ICD-10-CM | POA: Diagnosis not present

## 2021-06-23 DIAGNOSIS — R0989 Other specified symptoms and signs involving the circulatory and respiratory systems: Secondary | ICD-10-CM | POA: Diagnosis not present

## 2021-06-23 DIAGNOSIS — R1013 Epigastric pain: Secondary | ICD-10-CM | POA: Diagnosis not present

## 2021-06-24 ENCOUNTER — Emergency Department (HOSPITAL_BASED_OUTPATIENT_CLINIC_OR_DEPARTMENT_OTHER)
Admission: EM | Admit: 2021-06-24 | Discharge: 2021-06-25 | Disposition: A | Discharge status: Home / Self Care | Payer: BC Managed Care – PPO | Attending: Emergency Medicine | Admitting: Emergency Medicine

## 2021-06-24 ENCOUNTER — Emergency Department (HOSPITAL_BASED_OUTPATIENT_CLINIC_OR_DEPARTMENT_OTHER): Payer: BC Managed Care – PPO | Admitting: Radiology

## 2021-06-24 ENCOUNTER — Encounter (HOSPITAL_BASED_OUTPATIENT_CLINIC_OR_DEPARTMENT_OTHER): Payer: Self-pay | Admitting: Emergency Medicine

## 2021-06-24 ENCOUNTER — Other Ambulatory Visit: Payer: Self-pay

## 2021-06-24 DIAGNOSIS — I77810 Thoracic aortic ectasia: Secondary | ICD-10-CM

## 2021-06-24 DIAGNOSIS — Z20822 Contact with and (suspected) exposure to covid-19: Secondary | ICD-10-CM | POA: Diagnosis not present

## 2021-06-24 DIAGNOSIS — R0602 Shortness of breath: Secondary | ICD-10-CM | POA: Diagnosis not present

## 2021-06-24 DIAGNOSIS — R002 Palpitations: Secondary | ICD-10-CM | POA: Insufficient documentation

## 2021-06-24 LAB — BASIC METABOLIC PANEL
Anion gap: 10 (ref 5–15)
BUN: 14 mg/dL (ref 6–20)
CO2: 26 mmol/L (ref 22–32)
Calcium: 9.9 mg/dL (ref 8.9–10.3)
Chloride: 101 mmol/L (ref 98–111)
Creatinine, Ser: 1.04 mg/dL (ref 0.61–1.24)
GFR, Estimated: >60 mL/min (ref >60)
Glucose, Bld: 106 mg/dL — ABNORMAL HIGH (ref 70–99)
Potassium: 4 mmol/L (ref 3.5–5.1)
Sodium: 137 mmol/L (ref 135–145)

## 2021-06-24 LAB — TROPONIN I (HIGH SENSITIVITY)
Troponin I (High Sensitivity): 5 ng/L (ref <18)
Troponin I (High Sensitivity): 5 ng/L (ref <18)

## 2021-06-24 LAB — CBC
HCT: 41.6 % (ref 39.0–52.0)
Hemoglobin: 14 g/dL (ref 13.0–17.0)
MCH: 31.5 pg (ref 26.0–34.0)
MCHC: 33.7 g/dL (ref 30.0–36.0)
MCV: 93.5 fL (ref 80.0–100.0)
Platelets: 257 10*3/uL (ref 150–400)
RBC: 4.45 MIL/uL (ref 4.22–5.81)
RDW: 12.1 % (ref 11.5–15.5)
WBC: 5 10*3/uL (ref 4.0–10.5)
nRBC: 0 % (ref 0.0–0.2)

## 2021-06-24 MED ORDER — IPRATROPIUM-ALBUTEROL 0.5-2.5 (3) MG/3ML IN SOLN
RESPIRATORY_TRACT | Status: AC
  Filled 2021-06-24: qty 3

## 2021-06-24 MED ORDER — IPRATROPIUM-ALBUTEROL 0.5-2.5 (3) MG/3ML IN SOLN: 3.0000 mL | Freq: Once | RESPIRATORY_TRACT | Status: DC

## 2021-06-24 NOTE — ED Triage Notes (Signed)
Has been experiencing episodes of palpitations, upper abdominal pain, and sob since Tuesday. He associates sx with PO intake. Saw PCP yesterday and was given reflux and anxiety medication.  Today sx became worse again, noted his HR to be 155bpm at rest, called EMS and HR returned to normal.  Episodes typically last about 30 min.  Nonsmoker.  Denies fever, chills, N/V/D

## 2021-06-24 NOTE — ED Notes (Addendum)
Pt spoke to registration about patient experiencing worsening SOB while at rest in lobby. This RN and RT reassess in triage room. See RT assess. HR 115, spo2 100%, clammy in triage.

## 2021-06-25 ENCOUNTER — Emergency Department (HOSPITAL_BASED_OUTPATIENT_CLINIC_OR_DEPARTMENT_OTHER): Payer: BC Managed Care – PPO

## 2021-06-25 ENCOUNTER — Encounter (HOSPITAL_BASED_OUTPATIENT_CLINIC_OR_DEPARTMENT_OTHER): Payer: Self-pay | Admitting: Emergency Medicine

## 2021-06-25 DIAGNOSIS — R0602 Shortness of breath: Secondary | ICD-10-CM | POA: Diagnosis not present

## 2021-06-25 DIAGNOSIS — I3139 Other pericardial effusion (noninflammatory): Secondary | ICD-10-CM | POA: Diagnosis not present

## 2021-06-25 DIAGNOSIS — R079 Chest pain, unspecified: Secondary | ICD-10-CM | POA: Diagnosis not present

## 2021-06-25 LAB — RESP PANEL BY RT-PCR (FLU A&B, COVID) ARPGX2
Influenza A by PCR: NEGATIVE
Influenza B by PCR: NEGATIVE
SARS Coronavirus 2 by RT PCR: NEGATIVE

## 2021-06-25 MED ORDER — IOHEXOL 350 MG/ML SOLN
100.0000 mL | Freq: Once | INTRAVENOUS | Status: AC | PRN
  Administered 2021-06-25: 74 mL via INTRAVENOUS

## 2021-06-25 NOTE — ED Provider Notes (Signed)
[facility] Provider Note   CSN: [id number] Arrival date & time: 06/24/21  1913     History  Chief Complaint  Patient presents with   Palpitations    Isaiah Collins is a 51 y.o. male.  The history is provided by the patient.  Palpitations Palpitations quality:  Irregular Onset quality:  At rest Duration: days. Timing:  Intermittent Progression:  Worsening Chronicity:  New Context: anxiety   Context: not exercise and not hyperventilation   Relieved by:  Nothing Worsened by:  Nothing Ineffective treatments:  None tried Associated symptoms: shortness of breath   Associated symptoms: no chest pressure, no cough, no diaphoresis, no malaise/fatigue, no nausea, no vomiting and no weakness   Risk factors: diabetes mellitus   Risk factors: no hypercoagulable state and no OTC sinus medications   Patient recently placed on ativan for anxiety with recent social stressors presents with palpitations and SOB.  No DOE, no n/v/d.     Home Medications Prior to Admission medications   Medication Sig Start Date End Date Taking? Authorizing Provider  azithromycin (ZITHROMAX) 500 MG tablet Take 1 tablet (500 mg total) by mouth daily. 01/30/16   Rise MuLeaphart, Kenneth T, PA-C  Digestive Enzyme CAPS Take 1 capsule by mouth 3 (three) times daily before meals.    [provider]  loratadine (CLARITIN) 10 MG tablet Take 10 mg by mouth daily as needed for allergies.    [provider]  metroNIDAZOLE (FLAGYL) 500 MG tablet Take 1 tablet (500 mg total) by mouth 2 (two) times daily with a meal. DO NOT CONSUME ALCOHOL WHILE TAKING THIS MEDICATION. 01/30/16   Demetrios LollLeaphart, Kenneth T, PA-C  ondansetron (ZOFRAN) 4 MG tablet Take 1 tablet (4 mg total) by mouth every 6 (six) hours. 01/30/16   Rise MuLeaphart, Kenneth T, PA-C  Probiotic Product (PROBIOTIC PO) Take 1 capsule by mouth daily.    [provider]  Simethicone (GAS-X PO) Take 2 tablets by mouth daily as needed  (gas).    [provider]      Allergies    Patient has no known allergies.    Review of Systems   Review of Systems  Constitutional:  Negative for diaphoresis and malaise/fatigue.  HENT:  Negative for facial swelling.   Eyes:  Negative for redness.  Respiratory:  Positive for shortness of breath. Negative for cough.   Cardiovascular:  Positive for palpitations.  Gastrointestinal:  Negative for nausea and vomiting.  Genitourinary:  Negative for difficulty urinating.  Musculoskeletal:  Negative for neck stiffness.  Skin:  Negative for rash.  Neurological:  Negative for facial asymmetry and weakness.  Psychiatric/Behavioral:  Negative for agitation. The patient is nervous/anxious.   All other systems reviewed and are negative.  Physical Exam Updated Vital Signs BP 120/88    Pulse 71    Temp 97.7 F (36.5 C)    Resp 15    Wt 79.4 kg    SpO2 100%    BMI 21.87 kg/m  Physical Exam Vitals and nursing note reviewed.  Constitutional:      General: He is not in acute distress.    Appearance: Normal appearance.  HENT:     Head: Normocephalic and atraumatic.     Nose: Nose normal.  Eyes:     Conjunctiva/sclera: Conjunctivae normal.     Pupils: Pupils are equal, round, and reactive to light.  Cardiovascular:     Rate and Rhythm: Normal rate and regular rhythm.     Pulses: Normal pulses.  Heart sounds: Normal heart sounds.  Pulmonary:     Effort: Pulmonary effort is normal.     Breath sounds: Normal breath sounds.  Abdominal:     General: Abdomen is flat. Bowel sounds are normal.     Palpations: Abdomen is soft.     Tenderness: There is no abdominal tenderness. There is no guarding.  Musculoskeletal:        General: Normal range of motion.     Cervical back: Normal range of motion and neck supple.  Skin:    General: Skin is warm.     Capillary Refill: Capillary refill takes less than 2 seconds.  Neurological:     General: No focal deficit present.     Mental  Status: He is alert and oriented to person, place, and time.  Psychiatric:        Mood and Affect: Mood is anxious.    ED Results / Procedures / Treatments   Labs (all labs ordered are listed, but only abnormal results are displayed) Results for orders placed or performed during the hospital encounter of 06/24/21  Resp Panel by RT-PCR (Flu A&B, Covid) Nasopharyngeal Swab   Specimen: Nasopharyngeal Swab; Nasopharyngeal(NP) swabs in vial transport medium  Result Value Ref Range   SARS Coronavirus 2 by RT PCR NEGATIVE NEGATIVE   Influenza A by PCR NEGATIVE NEGATIVE   Influenza B by PCR NEGATIVE NEGATIVE  Basic metabolic panel  Result Value Ref Range   Sodium 137 135 - 145 mmol/L   Potassium 4.0 3.5 - 5.1 mmol/L   Chloride 101 98 - 111 mmol/L   CO2 26 22 - 32 mmol/L   Glucose, Bld 106 (H) 70 - 99 mg/dL   BUN 14 6 - 20 mg/dL   Creatinine, Ser 6.30 0.61 - 1.24 mg/dL   Calcium 9.9 8.9 - 16.0 mg/dL   GFR, Estimated >10 >93 mL/min   Anion gap 10 5 - 15  CBC  Result Value Ref Range   WBC 5.0 4.0 - 10.5 K/uL   RBC 4.45 4.22 - 5.81 MIL/uL   Hemoglobin 14.0 13.0 - 17.0 g/dL   HCT 23.5 57.3 - 22.0 %   MCV 93.5 80.0 - 100.0 fL   MCH 31.5 26.0 - 34.0 pg   MCHC 33.7 30.0 - 36.0 g/dL   RDW 25.4 27.0 - 62.3 %   Platelets 257 150 - 400 K/uL   nRBC 0.0 0.0 - 0.2 %  Troponin I (High Sensitivity)  Result Value Ref Range   Troponin I (High Sensitivity) 5 <18 ng/L  Troponin I (High Sensitivity)  Result Value Ref Range   Troponin I (High Sensitivity) 5 <18 ng/L   DG Chest 2 View  Result Date: 06/24/2021 CLINICAL DATA:  Palpitations. EXAM: CHEST - 2 VIEW COMPARISON:  None. FINDINGS: The heart size and mediastinal contours are within normal limits. Both lungs are clear. The visualized skeletal structures are unremarkable. IMPRESSION: No active cardiopulmonary disease. Electronically Signed   By: Darliss Cheney M.D.   On: 06/24/2021 19:53   CT Angio Chest PE W and/or Wo Contrast  Result Date:  06/25/2021 CLINICAL DATA:  Chest pain and shortness of breath. EXAM: CT ANGIOGRAPHY CHEST WITH CONTRAST TECHNIQUE: Multidetector CT imaging of the chest was performed using the standard protocol during bolus administration of intravenous contrast. Multiplanar CT image reconstructions and MIPs were obtained to evaluate the vascular anatomy. RADIATION DOSE REDUCTION: This exam was performed according to the departmental dose-optimization program which includes automated exposure control, adjustment of  the mA and/or kV according to patient size and/or use of iterative reconstruction technique. CONTRAST:  74mL OMNIPAQUE IOHEXOL 350 MG/ML SOLN COMPARISON:  PA and lateral chest earlier today, CT abdomen and pelvis with contrast 01/30/2016 FINDINGS: Cardiovascular: The cardiac size is normal. There is a small pericardial effusion anteriorly. Pulmonary arteries are normal in caliber without evidence of thromboemboli. No aortic dissection or plaques are seen. There is borderline aneurysmal prominence of the aortic root which measures 3.9 cm, with ectasia in the ascending segment which measures 3.7 cm. The remaining aorta is within normal caliber limits. The great vessels are unremarkable with normal variant brachiobicarotid trunk. The pulmonary veins are normal caliber. No findings of acute right heart strain. Mediastinum/Nodes: No enlarged mediastinal, hilar, or axillary lymph nodes. The mid and lower poles of the thyroid gland, trachea, and esophagus demonstrate no significant findings. Lungs/Pleura: Lungs are clear. No pleural effusion or pneumothorax. Central airways are clear. Upper Abdomen: No acute abnormality. Musculoskeletal: No chest wall abnormality. No acute or significant osseous findings. Review of the MIP images confirms the above findings. IMPRESSION: 1. No acute chest CT or CTA abnormality. 2. No visible aortic plaques, but with 3.9 cm prominence of the aortic root, mildly ectatic ascending segment 3.7 cm.  Electronically Signed   By: Almira BarKeith  Chesser M.D.   On: 06/25/2021 00:45   XR Knee 1-2 Views Left  Result Date: 06/20/2021 2 views of the left knee show no acute findings.  The joint space is well-maintained.  The alignment is neutral.  XR Toe Great Left  Result Date: 06/20/2021 2 views of the left great toe show a healing fracture of the proximal phalanx.   EKG EKG Interpretation  Date/Time:  Friday June 24 2021 19:21:45 EST Ventricular Rate:  91 PR Interval:  148 QRS Duration: 92 QT Interval:  344 QTC Calculation: 423 R Axis:   101 Text Interpretation: Normal sinus rhythm Right atrial enlargement Confirmed by Verenise Moulin ([id number]) on 06/24/2021 11:28:49 PM  Radiology DG Chest 2 View  Result Date: 06/24/2021 CLINICAL DATA:  Palpitations. EXAM: CHEST - 2 VIEW COMPARISON:  None. FINDINGS: The heart size and mediastinal contours are within normal limits. Both lungs are clear. The visualized skeletal structures are unremarkable. IMPRESSION: No active cardiopulmonary disease. Electronically Signed   By: Darliss CheneyAmy  Guttmann M.D.   On: 06/24/2021 19:53   CT Angio Chest PE W and/or Wo Contrast  Result Date: 06/25/2021 CLINICAL DATA:  Chest pain and shortness of breath. EXAM: CT ANGIOGRAPHY CHEST WITH CONTRAST TECHNIQUE: Multidetector CT imaging of the chest was performed using the standard protocol during bolus administration of intravenous contrast. Multiplanar CT image reconstructions and MIPs were obtained to evaluate the vascular anatomy. RADIATION DOSE REDUCTION: This exam was performed according to the departmental dose-optimization program which includes automated exposure control, adjustment of the mA and/or kV according to patient size and/or use of iterative reconstruction technique. CONTRAST:  74mL OMNIPAQUE IOHEXOL 350 MG/ML SOLN COMPARISON:  PA and lateral chest earlier today, CT abdomen and pelvis with contrast 01/30/2016 FINDINGS: Cardiovascular: The cardiac size is normal. There is a  small pericardial effusion anteriorly. Pulmonary arteries are normal in caliber without evidence of thromboemboli. No aortic dissection or plaques are seen. There is borderline aneurysmal prominence of the aortic root which measures 3.9 cm, with ectasia in the ascending segment which measures 3.7 cm. The remaining aorta is within normal caliber limits. The great vessels are unremarkable with normal variant brachiobicarotid trunk. The pulmonary veins are normal caliber. No findings  of acute right heart strain. Mediastinum/Nodes: No enlarged mediastinal, hilar, or axillary lymph nodes. The mid and lower poles of the thyroid gland, trachea, and esophagus demonstrate no significant findings. Lungs/Pleura: Lungs are clear. No pleural effusion or pneumothorax. Central airways are clear. Upper Abdomen: No acute abnormality. Musculoskeletal: No chest wall abnormality. No acute or significant osseous findings. Review of the MIP images confirms the above findings. IMPRESSION: 1. No acute chest CT or CTA abnormality. 2. No visible aortic plaques, but with 3.9 cm prominence of the aortic root, mildly ectatic ascending segment 3.7 cm. Electronically Signed   By: Almira Bar M.D.   On: 06/25/2021 00:45     Medications Ordered in ED Medications  ipratropium-albuterol (DUONEB) 0.5-2.5 (3) MG/3ML nebulizer solution 3 mL (3 mLs Nebulization Not Given 06/24/21 2341)  iohexol (OMNIPAQUE) 350 MG/ML injection 100 mL (74 mLs Intravenous Contrast Given 06/25/21 0018)    ED Course/ Medical Decision Making/ A&P                           Medical Decision Making Gelling pal  Amount and/or Complexity of Data Reviewed Labs: ordered.    Details: 2 negative troponins and normal electrolytes Radiology: ordered.    Details: Negative CTA for PE, negative cxr by me  Risk Prescription drug management. Risk Details: Patient is clearly anxious he is feeling arrhythmia when nothing is seen on the monitor.  I have even printed  rhythm strips as patient claims he saw this on the monitor in the room and nothing is seen on the monitor.  I have counted a total of 3 PVCs since patient was placed on the monitor.  3 PVCs in the 2.25 hours he has been here is not pathologic.  I believe this is mostly anxiety.  As he is speaking about his stressors his HR goes up 30 points.  I have advised close follow up with PMD for talk therapy and SSRI as I do not believe ativan is going to cure this.  I have informed the patient and his wife regarding ectasia of the aorta and need for close follow up.  This was an incidental finding and not the cause of today's issue and I have informed the patient of this but he needs to follow up for ongoing assessment.  Patient and wife verbalize understanding.  Patient ruled out for MI with negative EKG and 2 negative troponins.  Heart score is 1 low risk for MACE.  Story is highly atypical for ischemia.  Ruled out for PE with CTA in a low risk patient.  No alcohol and no caffeine   Final Clinical Impression(s) / ED Diagnoses Final diagnoses:  Palpitations  Aortic ectasia, thoracic (HCC)   Return for intractable cough, coughing up blood, fevers > 100.4 unrelieved by medication, shortness of breath, intractable vomiting, chest pain, shortness of breath, weakness, numbness, changes in speech, facial asymmetry, abdominal pain, passing out, Inability to tolerate liquids or food, cough, altered mental status or any concerns. No signs of systemic illness or infection. The patient is nontoxic-appearing on exam and vital signs are within normal limits.  I have reviewed the triage vital signs and the nursing notes. Pertinent labs & imaging results that were available during my care of the patient were reviewed by me and considered in my medical decision making (see chart for details). After history, exam, and medical workup I feel the patient has been appropriately medically screened and is safe for discharge  home.  Pertinent diagnoses were discussed with the patient. Patient was given return precautions.             Leilanie Rauda, MD 06/25/21 4098

## 2021-06-28 DIAGNOSIS — R0989 Other specified symptoms and signs involving the circulatory and respiratory systems: Secondary | ICD-10-CM | POA: Diagnosis not present

## 2021-06-28 DIAGNOSIS — R1013 Epigastric pain: Secondary | ICD-10-CM | POA: Diagnosis not present

## 2021-06-28 DIAGNOSIS — K824 Cholesterolosis of gallbladder: Secondary | ICD-10-CM | POA: Diagnosis not present

## 2021-06-28 DIAGNOSIS — Z789 Other specified health status: Secondary | ICD-10-CM | POA: Diagnosis not present

## 2021-07-12 ENCOUNTER — Other Ambulatory Visit: Payer: Self-pay

## 2021-07-12 ENCOUNTER — Institutional Professional Consult (permissible substitution) (INDEPENDENT_AMBULATORY_CARE_PROVIDER_SITE_OTHER): Payer: BC Managed Care – PPO | Admitting: Physician Assistant

## 2021-07-12 ENCOUNTER — Encounter: Payer: Self-pay | Admitting: Physician Assistant

## 2021-07-12 VITALS — BP 126/77 | HR 105 | Resp 20

## 2021-07-12 DIAGNOSIS — I7121 Aneurysm of the ascending aorta, without rupture: Secondary | ICD-10-CM | POA: Diagnosis not present

## 2021-07-12 NOTE — Progress Notes (Signed)
[address]       [place] [address]             [phone]       PCP is Jettie Booze, NP Referring Provider is Veatrice Kells, MD  Reason for consult: Incidental finding of mild aortic root enlargement and ectasia.  HPI: Mr. Mazzaferro is a healthy 51 year old male with history of gastroesophageal reflux disease and mild leukopenia was seen in the emergency room 2 weeks ago with complaint of a pulsatile sensation in his abdomen associated with shortness of breath and anxiety.  Work-up including CBC and serum chemistries including LFTs that showed no significant abnormality.  High-sensitivity troponin is 5.  EKG showed normal sinus rhythm with no acute abnormality.  Chest x-ray was unremarkable.  He had a CT angiogram of his chest that showed no evidence of pulmonary embolus but demonstrated mild dilation of the aortic root measurement 3.9 cm some mild ectasia of the ascending aorta.  The remainder of the study was unremarkable.  He was seen 1 day previous to the ER visit by his PCP with a similar complaint of sensation of abdominal pulsation.  Work-up included full laboratory panels including CBC, CMP, hepatic panel, amylase, and lipase all with no significant abnormality.  An abdominal ultrasound was ordered obtained following the ED visit that was also unremarkable. Mr. Kool consider himself healthy. He denies chest pain incision his abdominal discomfort has resolved as well.  He exercises on a regular basis.  He recently stopped drinking alcohol which had been a daily routine. He has no known personal or family history of connective tissue disorders.   Past Medical History:  Diagnosis Date   GERD (gastroesophageal reflux disease)    Leukopenia     No past surgical history on file.  Family History: History of cancer, HTN and COPD in his father History of cancer in his sister History of heart disease in his grandfather  Social History Social History    Tobacco Use   Smoking status: Never   Smokeless tobacco: Never  Substance Use Topics   Alcohol use: Yes    Comment: wine - 2-3 glasses each night   Drug use: No    Current Outpatient Medications  Medication Sig Dispense Refill   azithromycin (ZITHROMAX) 500 MG tablet Take 1 tablet (500 mg total) by mouth daily. 3 tablet 0   Digestive Enzyme CAPS Take 1 capsule by mouth 3 (three) times daily before meals.     loratadine (CLARITIN) 10 MG tablet Take 10 mg by mouth daily as needed for allergies.     metroNIDAZOLE (FLAGYL) 500 MG tablet Take 1 tablet (500 mg total) by mouth 2 (two) times daily with a meal. DO NOT CONSUME ALCOHOL WHILE TAKING THIS MEDICATION. 10 tablet 0   ondansetron (ZOFRAN) 4 MG tablet Take 1 tablet (4 mg total) by mouth every 6 (six) hours. 10 tablet 0   Probiotic Product (PROBIOTIC PO) Take 1 capsule by mouth daily.     Simethicone (GAS-X PO) Take 2 tablets by mouth daily as needed (gas).     No current facility-administered medications for this visit.    No Known Allergies  Review of Systems: Review of Systems  Constitutional: Negative.   Eyes: Negative.   Respiratory:  Positive for shortness of breath.   Cardiovascular:  Positive for palpitations.  Gastrointestinal:  Positive for diarrhea.  Skin: Negative.   Neurological: Negative.   Endo/Heme/Allergies: Negative.   Psychiatric/Behavioral:  Anxiety    Vital signs: BP 126/77 Pulse 105 Respirations 20 SPO2 98% on room air  Physical Exam: General: Pleasant 51 year old male with an athletic build in no acute distress.  Skin is warm and dry Neck: No carotid bruit, no JVD Heart: Regular rate and rhythm, no murmur. Chest: Breath sounds are full, equal, and clear to auscultation. Abdomen: Soft, flat, and nontender.  No abdominal bruits Extremities: No obvious deformities.  Mild superficial varicosities in the lower extremities.  All distal pulses are palpable.  No peripheral edema.  Diagnostic  Tests: CLINICAL DATA:  Chest pain and shortness of breath.   EXAM: CT ANGIOGRAPHY CHEST WITH CONTRAST   TECHNIQUE: Multidetector CT imaging of the chest was performed using the standard protocol during bolus administration of intravenous contrast. Multiplanar CT image reconstructions and MIPs were obtained to evaluate the vascular anatomy.   RADIATION DOSE REDUCTION: This exam was performed according to the departmental dose-optimization program which includes automated exposure control, adjustment of the mA and/or kV according to patient size and/or use of iterative reconstruction technique.   CONTRAST:  39mL OMNIPAQUE IOHEXOL 350 MG/ML SOLN   COMPARISON:  PA and lateral chest earlier today, CT abdomen and pelvis with contrast 01/30/2016   FINDINGS: Cardiovascular: The cardiac size is normal. There is a small pericardial effusion anteriorly. Pulmonary arteries are normal in caliber without evidence of thromboemboli. No aortic dissection or plaques are seen. There is borderline aneurysmal prominence of the aortic root which measures 3.9 cm, with ectasia in the ascending segment which measures 3.7 cm.   The remaining aorta is within normal caliber limits. The great vessels are unremarkable with normal variant brachiobicarotid trunk. The pulmonary veins are normal caliber. No findings of acute right heart strain.   Mediastinum/Nodes: No enlarged mediastinal, hilar, or axillary lymph nodes. The mid and lower poles of the thyroid gland, trachea, and esophagus demonstrate no significant findings.   Lungs/Pleura: Lungs are clear. No pleural effusion or pneumothorax. Central airways are clear.   Upper Abdomen: No acute abnormality.   Musculoskeletal: No chest wall abnormality. No acute or significant osseous findings.   Review of the MIP images confirms the above findings.   IMPRESSION: 1. No acute chest CT or CTA abnormality. 2. No visible aortic plaques, but with 3.9 cm  prominence of the aortic root, mildly ectatic ascending segment 3.7 cm.     Electronically Signed   By: Telford Nab M.D.   On: 06/25/2021 00:45    Impression / Plan: 51 year old male incidentally found to have mild 3.9 cm dilation of the aortic root with later ascending aortic ectasia.  This was noted on CT that he had about 2 and half weeks ago after presenting to the emergency room with some shortness of breath and palpitations.  ED work-up was otherwise unremarkable.  He has no family history of aortic disease or connective tissue disorders.  His blood pressure is normal.   I reassured him that there is no indication for intervention at this point.  I recommended an echocardiogram as a baseline for his next visit although he has no murmur.  He should have a repeat chest CTA in 1 year.  Advised him to keep a close check on his blood pressure and seek treatment evaluated by his PCP should he notice consistent pressures in the greater than 130/90.  Antony Odea, PA-C [facility] [phone]

## 2021-07-12 NOTE — Patient Instructions (Signed)
Close watch on your blood pressure and contact your PCP if you notice consistent blood pressure greater than 130/90.  Follow-up in 1 year with chest CTA and echocardiogram.

## 2021-07-21 DIAGNOSIS — Z13220 Encounter for screening for lipoid disorders: Secondary | ICD-10-CM | POA: Diagnosis not present

## 2021-07-21 DIAGNOSIS — Z79899 Other long term (current) drug therapy: Secondary | ICD-10-CM | POA: Diagnosis not present

## 2021-07-21 DIAGNOSIS — R002 Palpitations: Secondary | ICD-10-CM | POA: Diagnosis not present

## 2021-07-21 DIAGNOSIS — Z0000 Encounter for general adult medical examination without abnormal findings: Secondary | ICD-10-CM | POA: Diagnosis not present

## 2021-07-21 DIAGNOSIS — Z125 Encounter for screening for malignant neoplasm of prostate: Secondary | ICD-10-CM | POA: Diagnosis not present

## 2021-07-22 ENCOUNTER — Telehealth: Payer: Self-pay

## 2021-07-22 NOTE — Telephone Encounter (Signed)
Scanned referral to referral

## 2021-07-22 NOTE — Telephone Encounter (Signed)
NOTES SCANNED TO REFERRAL 

## 2021-08-08 DIAGNOSIS — R21 Rash and other nonspecific skin eruption: Secondary | ICD-10-CM | POA: Diagnosis not present

## 2021-08-08 DIAGNOSIS — W57XXXS Bitten or stung by nonvenomous insect and other nonvenomous arthropods, sequela: Secondary | ICD-10-CM | POA: Diagnosis not present

## 2021-09-10 DIAGNOSIS — M542 Cervicalgia: Secondary | ICD-10-CM | POA: Diagnosis not present

## 2022-04-19 DIAGNOSIS — R109 Unspecified abdominal pain: Secondary | ICD-10-CM | POA: Diagnosis not present

## 2022-04-19 DIAGNOSIS — R195 Other fecal abnormalities: Secondary | ICD-10-CM | POA: Diagnosis not present

## 2022-04-19 DIAGNOSIS — R634 Abnormal weight loss: Secondary | ICD-10-CM | POA: Diagnosis not present

## 2022-04-19 DIAGNOSIS — R5383 Other fatigue: Secondary | ICD-10-CM | POA: Diagnosis not present

## 2022-05-08 ENCOUNTER — Ambulatory Visit (INDEPENDENT_AMBULATORY_CARE_PROVIDER_SITE_OTHER): Payer: BC Managed Care – PPO | Admitting: Orthopaedic Surgery

## 2022-05-08 ENCOUNTER — Encounter: Payer: Self-pay | Admitting: Orthopaedic Surgery

## 2022-05-08 DIAGNOSIS — G8929 Other chronic pain: Secondary | ICD-10-CM

## 2022-05-08 DIAGNOSIS — M25562 Pain in left knee: Secondary | ICD-10-CM | POA: Diagnosis not present

## 2022-05-08 NOTE — Progress Notes (Signed)
The patient is a 51 year old avid runner well-known to me.  I have been seeing him for almost a year now as it relates to his left knee.  He has tried activity modification and a steroid injection.  He is worked on [employer].  He is a thin and active individual.  He is even rested his knee.  He has times where a cystic-like structure gets larger on the medial aspect of his knee and then get smaller again.  That is been most of his pain is on the medial aspect of his knee and he is now having some locking and catching with pivoting activities and mechanical symptoms on that left knee.  On exam there is no effusion today but there is slight varus malalignment of his left knee.  There is significant medial joint line tenderness and a positive McMurray's sign to the medial aspect of the knee.  His previous plain films of the left knee were unremarkable with normal joint space.  Given the failure of conservative treatment for almost a year as it relates to his left knee, a MRI of the left knee is warranted to assess the cartilage and look for meniscal tear.  I am concerned about a potential parameniscal cyst as well.  We will see him back once we have seen the MRI of his left knee and then can go from there in terms of treatment.  He agrees with this plan.

## 2022-05-08 NOTE — Addendum Note (Signed)
Addended by: Barbette Or on: 05/08/2022 04:27 PM   Modules accepted: Orders

## 2022-05-25 DIAGNOSIS — Z13220 Encounter for screening for lipoid disorders: Secondary | ICD-10-CM | POA: Diagnosis not present

## 2022-05-25 DIAGNOSIS — R195 Other fecal abnormalities: Secondary | ICD-10-CM | POA: Diagnosis not present

## 2022-05-25 DIAGNOSIS — K589 Irritable bowel syndrome without diarrhea: Secondary | ICD-10-CM | POA: Diagnosis not present

## 2022-05-25 DIAGNOSIS — J069 Acute upper respiratory infection, unspecified: Secondary | ICD-10-CM | POA: Diagnosis not present

## 2022-06-03 DIAGNOSIS — J209 Acute bronchitis, unspecified: Secondary | ICD-10-CM | POA: Diagnosis not present

## 2022-06-03 DIAGNOSIS — Z03818 Encounter for observation for suspected exposure to other biological agents ruled out: Secondary | ICD-10-CM | POA: Diagnosis not present

## 2022-06-09 ENCOUNTER — Other Ambulatory Visit: Payer: Self-pay | Admitting: Thoracic Surgery (Cardiothoracic Vascular Surgery)

## 2022-06-09 DIAGNOSIS — I7121 Aneurysm of the ascending aorta, without rupture: Secondary | ICD-10-CM

## 2022-06-09 DIAGNOSIS — R011 Cardiac murmur, unspecified: Secondary | ICD-10-CM

## 2022-07-11 ENCOUNTER — Ambulatory Visit (HOSPITAL_COMMUNITY): Payer: 59 | Attending: Thoracic Surgery (Cardiothoracic Vascular Surgery)

## 2022-07-11 DIAGNOSIS — I7121 Aneurysm of the ascending aorta, without rupture: Secondary | ICD-10-CM | POA: Insufficient documentation

## 2022-07-11 LAB — ECHOCARDIOGRAM COMPLETE
Area-P 1/2: 2.94 cm2
S' Lateral: 3 cm

## 2022-07-12 NOTE — Progress Notes (Unsigned)
[address]       [place] [address]             [phone]   PCP is Jettie Booze, NP Referring Provider is Jettie Booze, NP  Chief Complaint: Mild dilation of the aortic root with ascending aortic ectasia  HPI: This is a 52 year old male with a past medical history of GERD and leukopenia who during work up for pulsatile sensation in his abdomen associated with shortness of breath. He had a CT angiogram of his chest that showed no evidence of pulmonary embolus but demonstrated mild dilation of the aortic root measurement 3.9 cm some mild ectasia of the ascending aorta. He was last seen by [facility] in February 2023 and the ATAA at that time was measured at 3.9 cm prominence of the aortic root and mildly ectatic ascending segment 3.7 cm. Patient denies chest pain, pressure, tightness, LE edema.  Past Medical History:  Diagnosis Date   GERD (gastroesophageal reflux disease)    Leukopenia    Surgical History: 2 lymph nodes in his arm were removed that were related to an infectious process  Family History: Mother is alive at age 42 Father is decreased. He had an abdominal aortic aneurysm, but did not die from this  Social History Social History   Tobacco Use   Smoking status: Never   Smokeless tobacco: Never  Substance Use Topics   Alcohol use: Yes    Comment: wine - 2-3 glasses each night   Drug use: No    Current Outpatient Medications  Medication Sig Dispense Refill   Probiotic Product (PROBIOTIC PO) Take 1 capsule by mouth daily.     Allergies: No Known Allergies  Review of Systems:  Chest Pain [  N] Exertional SOB [ N ]  Pedal Edema [N] Syncope [ N ]  General Review of Systems: [Y] = yes [ N]=no  Consitutional:   nausea Aqua.Slicker ];  fever Aqua.Slicker ];  Eye :  Amaurosis fugax[ N ];  Resp:  hemoptysis[ N];  GI:  melena[ N]; hematochezia [N];  [id number] N]; Heme/Lymph: anemia[N ];  Neuro: TIA[N ];stroke[N ];  seizures[ N];  Endocrine: diabetes[  N];   Vital Signs: Vitals:   07/18/22 1350  BP: (!) 143/76  Pulse: 70  Resp: 18  SpO2: 99%     Physical Exam CV-RRR, no murmur Neck-no carotid bruit Pulmonary-Clear to auscultation bilaterally Extremities-No LE edema Neurologic-Grossly intact without focal deficit   Diagnostic Tests:  Narrative & Impression  CLINICAL DATA:  Aortic aneurysm suspected   EXAM: CT ANGIOGRAPHY CHEST WITH CONTRAST   TECHNIQUE: Multidetector CT imaging of the chest was performed using the standard protocol during bolus administration of intravenous contrast. Multiplanar CT image reconstructions and MIPs were obtained to evaluate the vascular anatomy.   RADIATION DOSE REDUCTION: This exam was performed according to the departmental dose-optimization program which includes automated exposure control, adjustment of the mA and/or kV according to patient size and/or use of iterative reconstruction technique.   CONTRAST:  24m ISOVUE-370 IOPAMIDOL (ISOVUE-370) INJECTION 76%   COMPARISON:  Prior CTA of the chest 06/25/2021   FINDINGS: Cardiovascular: Variant arch anatomy. The right brachiocephalic, 2 vessel aortic arch. The right brachiocephalic and left common carotid artery share a common origin. The aorta is normal in caliber measuring up to 3.7 cm in diameter. No evidence of dissection. No significant atherosclerotic plaque. Calcifications are present along the coronary arteries. The heart is normal in size.  No pericardial effusion.   Mediastinum/Nodes: Unremarkable CT appearance of the thyroid gland. No suspicious mediastinal or hilar adenopathy. No soft tissue mediastinal mass. The thoracic esophagus is unremarkable.   Lungs/Pleura: Lungs are clear. No pleural effusion or pneumothorax.   Upper Abdomen: No acute abnormality.   Musculoskeletal: No acute fracture or aggressive appearing lytic or blastic osseous lesion.   Review of the MIP images confirms the above findings.    IMPRESSION: 1. Minimally ectatic but nonaneurysmal ascending thoracic aorta with a maximal diameter of 3.7 cm. 2. Atherosclerotic calcification along the coronary arteries. Please note that although the presence of coronary artery calcium documents the presence of coronary artery disease, the severity of this disease and any potential stenosis cannot be assessed on this non-gated CT examination. Assessment for potential risk factor modification, dietary therapy or pharmacologic therapy may be warranted, if clinically indicated. 3. No acute cardiopulmonary process.   Signed,   Criselda Peaches, MD, [place]   [facility]   [facility]     Electronically Signed   By: Jacqulynn Cadet M.D.   On: 07/18/2022 13:10     Impression and Plan: CTA with an ectatic ascending aorta that measures 3.7 cm.  I spoke with one of our surgeon's as patient wants to know, does he have to continue to be followed yearly. I called patient on 07/19/2022 and explained that based on the measurements of his 2 scans, his ascending thoracic aorta and aortic root are not aneurysmal yet. In addition, his echocardiogram shows a tri cuspid valve without evidence of regurgitation and mild aortic dilatation to 40 mm. He does not need continued surveillance for this. Patient asked what is recommended for determining the next step in reference to the finding of atherosclerotic calcification along the coronary arteries. Again, I discussed with surgeon and it is recommended that his PCP (he just switched to a new PCP) consider his risk factors to help determine next best step to further evaluate degree of atherosclerotic calcification. We did discuss the importance of good BP control (130/80 or less), low salt heart healthy diet in reference to above. Of note, he does run almost daily and was encouraged to continuing do so;however, if he has palpitations, chest pain, or shortness  of breath to is to stop and seek immediate medical attention.     Nani Skillern, PA-C [facility] [phone]

## 2022-07-18 ENCOUNTER — Ambulatory Visit (INDEPENDENT_AMBULATORY_CARE_PROVIDER_SITE_OTHER): Payer: 59 | Admitting: Physician Assistant

## 2022-07-18 ENCOUNTER — Ambulatory Visit
Admission: RE | Admit: 2022-07-18 | Discharge: 2022-07-18 | Disposition: A | Discharge status: Home / Self Care | Payer: 59 | Source: Ambulatory Visit | Attending: Thoracic Surgery (Cardiothoracic Vascular Surgery) | Admitting: Thoracic Surgery (Cardiothoracic Vascular Surgery)

## 2022-07-18 VITALS — BP 143/76 | HR 70 | Resp 18 | Ht 75.0 in | Wt 184.0 lb

## 2022-07-18 DIAGNOSIS — I77810 Thoracic aortic ectasia: Secondary | ICD-10-CM | POA: Diagnosis not present

## 2022-07-18 DIAGNOSIS — I7121 Aneurysm of the ascending aorta, without rupture: Secondary | ICD-10-CM | POA: Diagnosis not present

## 2022-07-18 DIAGNOSIS — I2510 Atherosclerotic heart disease of native coronary artery without angina pectoris: Secondary | ICD-10-CM | POA: Diagnosis not present

## 2022-07-18 MED ORDER — IOPAMIDOL (ISOVUE-370) INJECTION 76%
75.0000 mL | Freq: Once | INTRAVENOUS | Status: AC | PRN
  Administered 2022-07-18: 75 mL via INTRAVENOUS

## 2022-07-18 NOTE — Patient Instructions (Signed)
  Risk Modification in those with ascending thoracic aortic aneurysm:  Continue good control of blood pressure (prefer SBP 130/80 or less)  2. Avoid fluoroquinolone antibiotics (I.e Ciprofloxacin, Avelox, Levofloxacin, Ofloxacin)  3.  Use of statin (to decrease cardiovascular risk)  4.  Exercise and activity limitations is individualized, but in general, contact sports are to be  avoided and one should avoid heavy lifting (defined as half of ideal body weight) and exercises involving sustained Valsalva maneuver.  5. Counseling for those suspected of having genetically mediated disease. First-degree relatives of those with TAA disease should be screened as well as those who have a connective tissue disease (I.e with Marfan syndrome, Ehlers-Danlos syndrome,  and Loeys-Dietz syndrome) or a  bicuspid aortic valve,have an increased risk for  complications related to TAA. Echo done02/13/2024 showed aortic valve to be tricuspid, No aortic stenosis or insufficiency, and mild aortic dilatation to 40 mm.  6. He has no history of tobacco abuse

## 2022-07-19 ENCOUNTER — Encounter: Payer: Self-pay | Admitting: Physician Assistant

## 2022-07-21 DIAGNOSIS — R9389 Abnormal findings on diagnostic imaging of other specified body structures: Secondary | ICD-10-CM | POA: Diagnosis not present

## 2022-07-21 DIAGNOSIS — K589 Irritable bowel syndrome without diarrhea: Secondary | ICD-10-CM | POA: Diagnosis not present

## 2022-07-21 DIAGNOSIS — F411 Generalized anxiety disorder: Secondary | ICD-10-CM | POA: Diagnosis not present

## 2022-08-11 ENCOUNTER — Telehealth (HOSPITAL_COMMUNITY): Payer: Self-pay | Admitting: Cardiology

## 2022-08-16 ENCOUNTER — Ambulatory Visit (HOSPITAL_COMMUNITY): Payer: 59 | Admitting: Cardiology

## 2022-09-29 DIAGNOSIS — Z125 Encounter for screening for malignant neoplasm of prostate: Secondary | ICD-10-CM | POA: Diagnosis not present

## 2022-09-29 DIAGNOSIS — Z13220 Encounter for screening for lipoid disorders: Secondary | ICD-10-CM | POA: Diagnosis not present

## 2022-09-29 DIAGNOSIS — R7309 Other abnormal glucose: Secondary | ICD-10-CM | POA: Diagnosis not present

## 2022-09-29 DIAGNOSIS — R7989 Other specified abnormal findings of blood chemistry: Secondary | ICD-10-CM | POA: Diagnosis not present

## 2022-09-29 DIAGNOSIS — Z79899 Other long term (current) drug therapy: Secondary | ICD-10-CM | POA: Diagnosis not present

## 2022-09-29 DIAGNOSIS — E559 Vitamin D deficiency, unspecified: Secondary | ICD-10-CM | POA: Diagnosis not present

## 2022-10-05 DIAGNOSIS — R7309 Other abnormal glucose: Secondary | ICD-10-CM | POA: Diagnosis not present

## 2022-10-05 DIAGNOSIS — Z125 Encounter for screening for malignant neoplasm of prostate: Secondary | ICD-10-CM | POA: Diagnosis not present

## 2022-10-05 DIAGNOSIS — Z79899 Other long term (current) drug therapy: Secondary | ICD-10-CM | POA: Diagnosis not present

## 2022-10-05 DIAGNOSIS — Z0000 Encounter for general adult medical examination without abnormal findings: Secondary | ICD-10-CM | POA: Diagnosis not present

## 2022-10-05 DIAGNOSIS — Z13220 Encounter for screening for lipoid disorders: Secondary | ICD-10-CM | POA: Diagnosis not present

## 2022-10-05 DIAGNOSIS — R7989 Other specified abnormal findings of blood chemistry: Secondary | ICD-10-CM | POA: Diagnosis not present

## 2022-10-05 DIAGNOSIS — E559 Vitamin D deficiency, unspecified: Secondary | ICD-10-CM | POA: Diagnosis not present

## 2022-12-23 DIAGNOSIS — S92354A Nondisplaced fracture of fifth metatarsal bone, right foot, initial encounter for closed fracture: Secondary | ICD-10-CM | POA: Diagnosis not present

## 2022-12-25 ENCOUNTER — Telehealth: Payer: Self-pay | Admitting: Orthopaedic Surgery

## 2022-12-25 NOTE — Telephone Encounter (Signed)
Pt called in stated has broken foot and is requesting to see Magnus Ivan stated he is a personal friend

## 2022-12-28 ENCOUNTER — Encounter: Payer: Self-pay | Admitting: Orthopaedic Surgery

## 2022-12-28 ENCOUNTER — Ambulatory Visit (INDEPENDENT_AMBULATORY_CARE_PROVIDER_SITE_OTHER): Payer: 59 | Admitting: Orthopaedic Surgery

## 2022-12-28 ENCOUNTER — Other Ambulatory Visit (INDEPENDENT_AMBULATORY_CARE_PROVIDER_SITE_OTHER): Payer: 59

## 2022-12-28 DIAGNOSIS — M79671 Pain in right foot: Secondary | ICD-10-CM | POA: Diagnosis not present

## 2022-12-28 DIAGNOSIS — S92351A Displaced fracture of fifth metatarsal bone, right foot, initial encounter for closed fracture: Secondary | ICD-10-CM

## 2022-12-28 NOTE — Progress Notes (Signed)
The patient is a 52 year old friend of mine who injured his right foot last weekend at a water park.  He landed awkwardly after jumping over a wave.  He was seen at an orthopedic urgent care clinic and found to have 5th metatarsal shaft fracture of his right foot.  He was placed appropriately in a cam walking boot with crutches and nonweightbearing.  He is here for follow-up.  He is not a diabetic.  He is very healthy and thin 52 year old gentleman and he does eat healthily as well.  He does not smoke.  On exam he does have bruising of his right foot with some swelling.  Clinically his MTP joints of the right foot are aligned fine and he is neurovascularly intact.  3 views of the right foot show a metatarsal shaft fracture of the fifth metatarsal with just some displacement.  The overall alignment is acceptable and the joints are congruent.  I gave him reassurance that this should do well with time.  He needs to go slow and can partial weight-bear and use of his walking boot or even a postop shoe.  He does have 1 of those at home.  I want him to try some vitamin D supplements with at least 2000 to 4000 international units a day.  We we will see him back in 3 weeks with a repeat 3 views of his right foot.

## 2023-01-18 ENCOUNTER — Encounter: Payer: Self-pay | Admitting: Orthopaedic Surgery

## 2023-01-18 ENCOUNTER — Other Ambulatory Visit (INDEPENDENT_AMBULATORY_CARE_PROVIDER_SITE_OTHER): Payer: 59

## 2023-01-18 ENCOUNTER — Ambulatory Visit (INDEPENDENT_AMBULATORY_CARE_PROVIDER_SITE_OTHER): Payer: 59 | Admitting: Orthopaedic Surgery

## 2023-01-18 DIAGNOSIS — M79671 Pain in right foot: Secondary | ICD-10-CM | POA: Diagnosis not present

## 2023-01-18 NOTE — Progress Notes (Signed)
Fordyce is here for follow-up 3 weeks after sustaining 1/5 metatarsal fracture of his right foot.  He had landed on the foot awkwardly at a water park.  He says his pain is now really minimal and he is doing well.  He is ambulating in a postop shoe.  He has minimal bruising and minimal pain along his right foot.  His foot is well-perfused.  Of note he is not a diabetic and not a smoker.  X-rays of the right foot show the fracture is in good alignment.  It is difficult to tell interval healing just yet but I can see that it is not changed in alignment actually looks a little better.  I am fine with him playing in a golf tournament over 3 weeks from now.  He knows to avoid any high impact activities of the left foot.  I would like to see him back in 4 weeks with a repeat 3 views of his right foot.  All questions and concerns were addressed and answered.

## 2023-02-15 ENCOUNTER — Encounter: Payer: Self-pay | Admitting: Orthopaedic Surgery

## 2023-02-15 ENCOUNTER — Other Ambulatory Visit (INDEPENDENT_AMBULATORY_CARE_PROVIDER_SITE_OTHER): Payer: 59

## 2023-02-15 ENCOUNTER — Ambulatory Visit (INDEPENDENT_AMBULATORY_CARE_PROVIDER_SITE_OTHER): Payer: 59 | Admitting: Orthopaedic Surgery

## 2023-02-15 DIAGNOSIS — M79671 Pain in right foot: Secondary | ICD-10-CM

## 2023-02-15 NOTE — Progress Notes (Signed)
Isaiah Collins is now about 7 weeks out from injury to his right foot in which she sustained 5th metatarsal shaft fracture.  We originally treated this with weightbearing as tolerated in a postop shoe.  I believe he may have been even put in a cam walking boot at first would not weightbearing crutches.  He is now ambulating in regular shoes and not having any significant discomfort.    3 views of the right foot today show that the fracture is almost completely healed of his fifth metatarsal on that right foot.  On exam there is really no discomfort with his foot.  He did play golf Monday.  From my standpoint I want him to hold off running and high impact sports for at least 4 more weeks but other than that he can resume activities as comfort allows.  He should really just listen to his body and let his body be the guide in terms of the pain he is having.  If he is having no pain he can fully loaded that foot with high impact aerobic activity such as running.

## 2023-03-03 IMAGING — DX DG CHEST 2V
2 series · 2 of 2 positions shown · non-contrast
Comparison: None.

CLINICAL DATA: Palpitations.

EXAM:
CHEST - 2 VIEW

[chest pa]
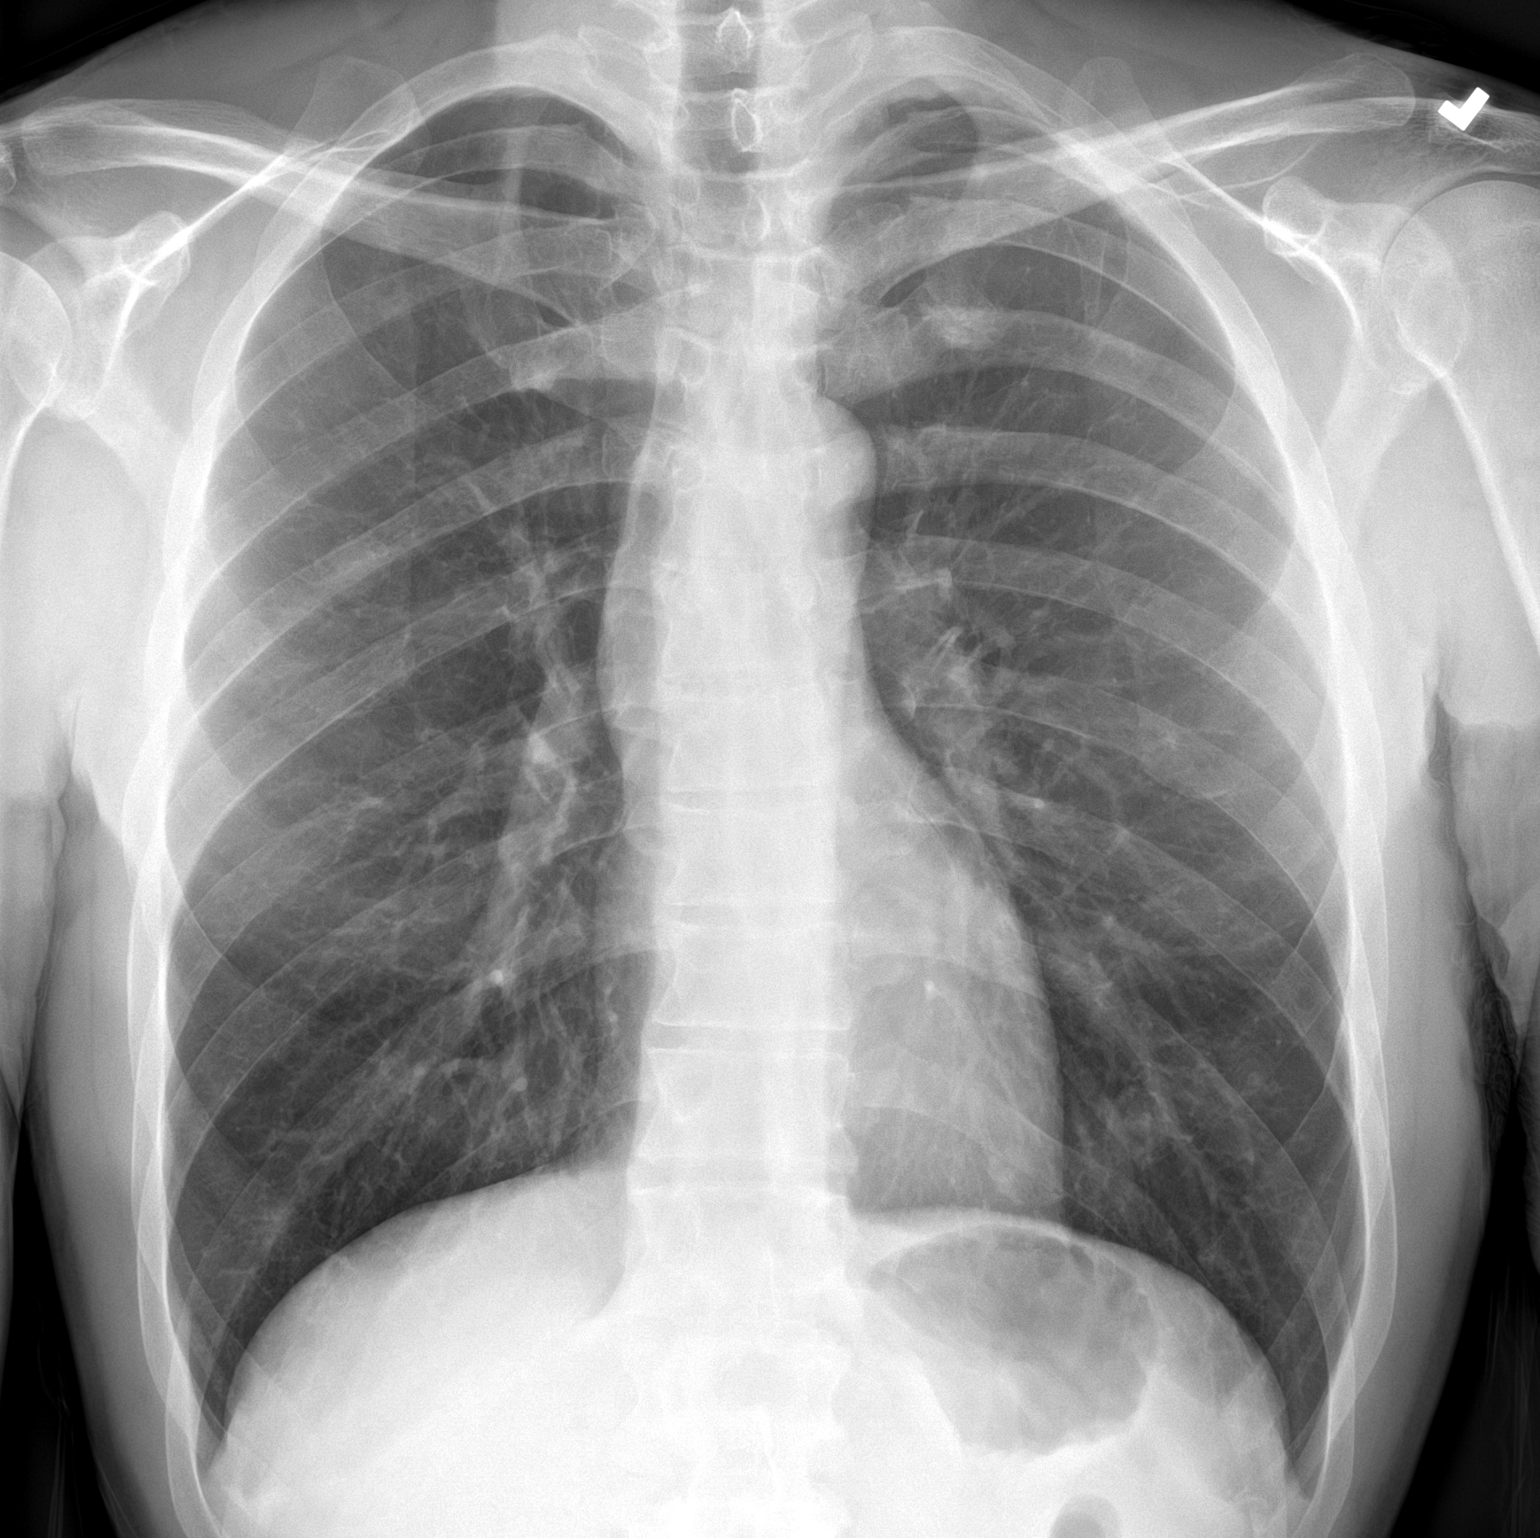

[chest lat]
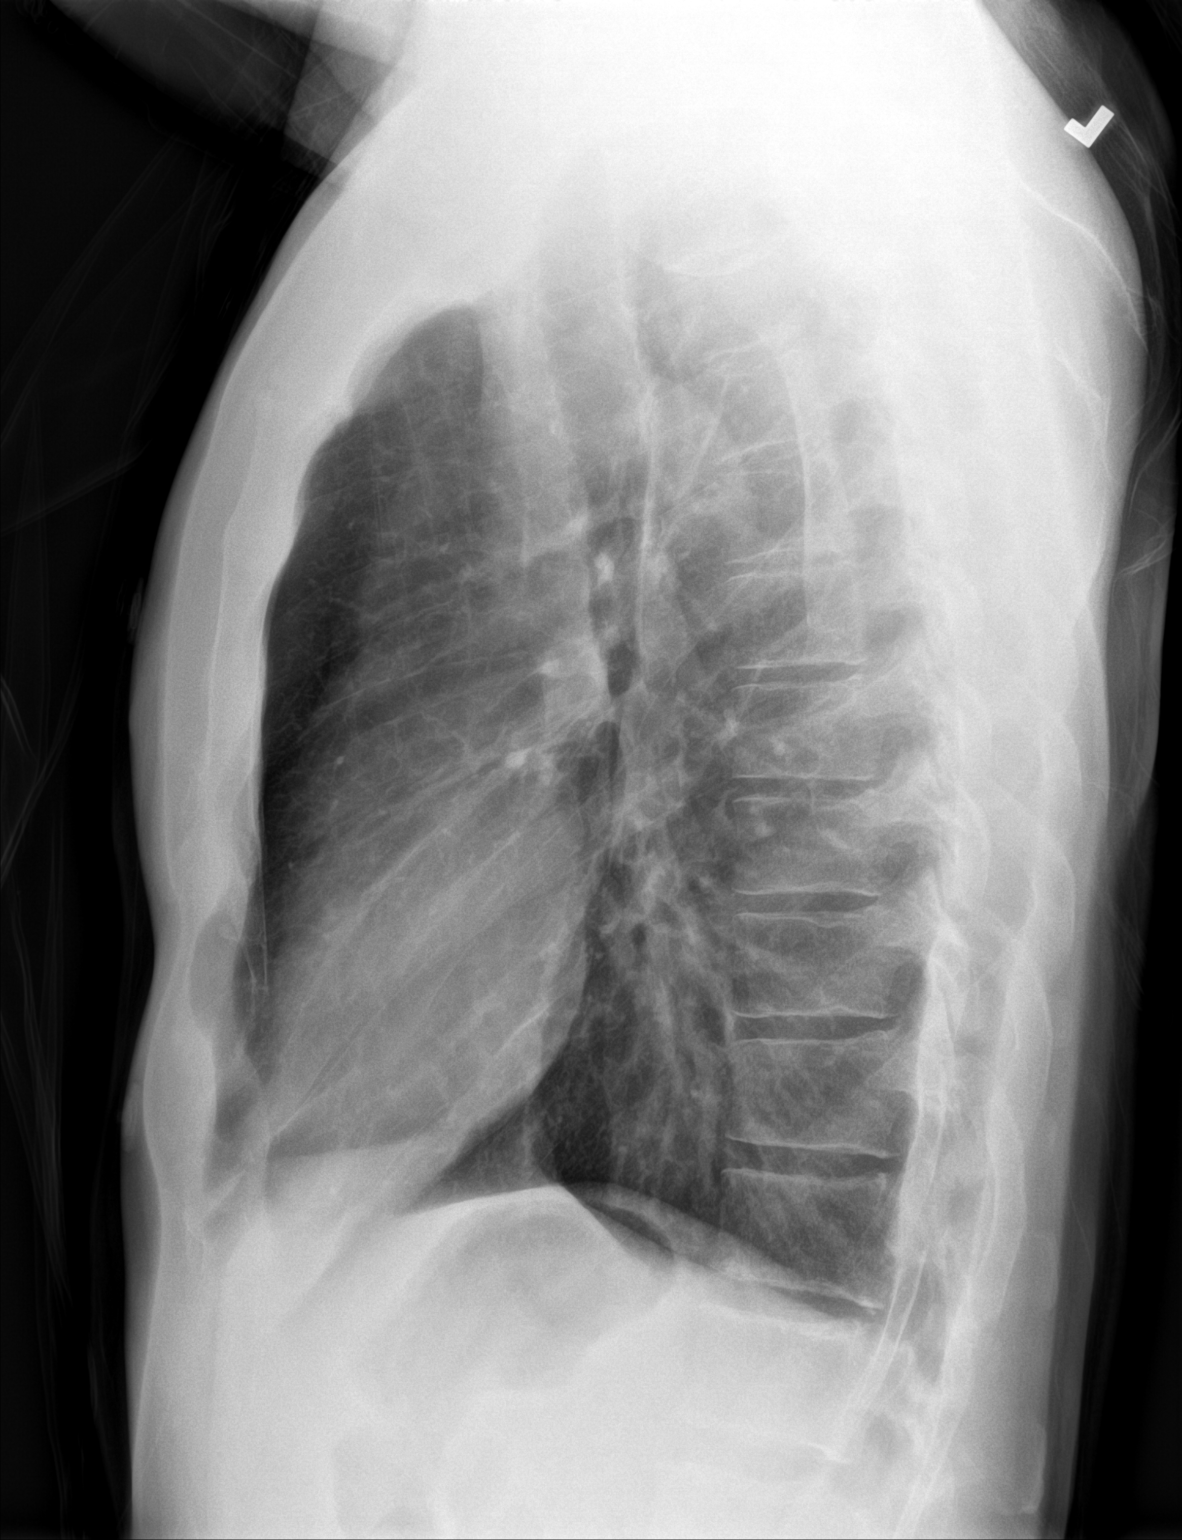

[2 of 2 positions shown; findings below may reference images not displayed]

FINDINGS: The heart size and mediastinal contours are within normal limits.
Both lungs are clear. The visualized skeletal structures are
unremarkable.
IMPRESSION: No active cardiopulmonary disease.

## 2023-03-04 IMAGING — CT CT ANGIO CHEST
2 of 7 series · 17 of 46 positions shown · IV contrast (agent unspecified)
Comparison: PA and lateral chest earlier today, CT abdomen and
pelvis with contrast 01/30/2016

CLINICAL DATA: Chest pain and shortness of breath.

EXAM:
CT ANGIOGRAPHY CHEST WITH CONTRAST
TECHNIQUE: Multidetector CT imaging of the chest was performed using the
standard protocol during bolus administration of intravenous
contrast. Multiplanar CT image reconstructions and MIPs were
obtained to evaluate the vascular anatomy.

[Series 5: pe axial thins · axial · 0.82mm/px · z∈[-468,-147]mm · 14 of 371 slices shown]
[im 25/371  lung]
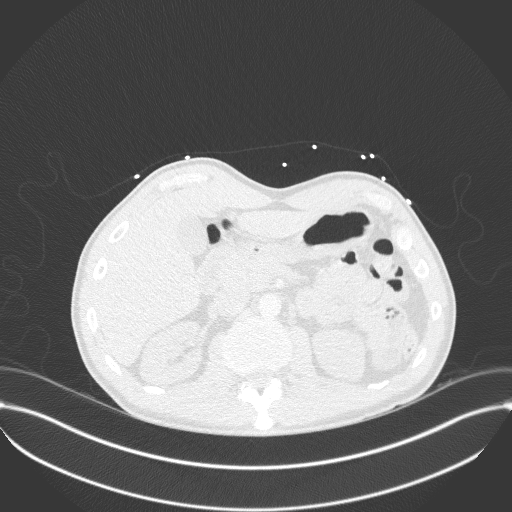
[im 50/371  soft-tissue]
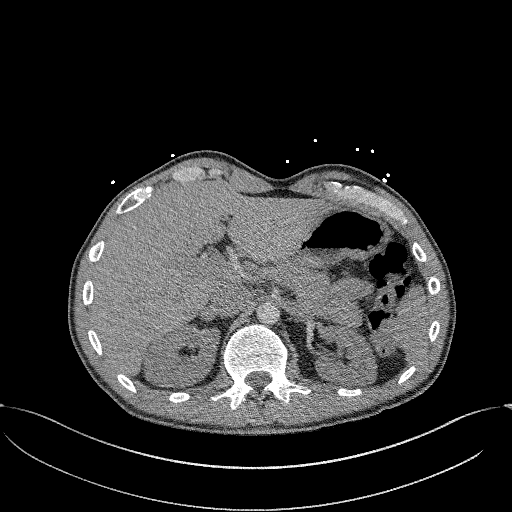
[im 75/371  lung]
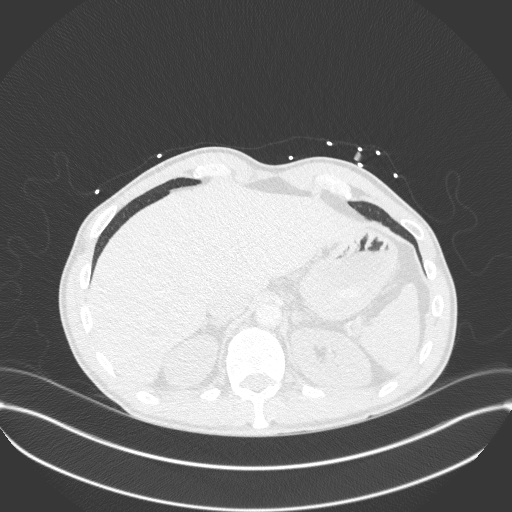
[im 99/371  soft-tissue]
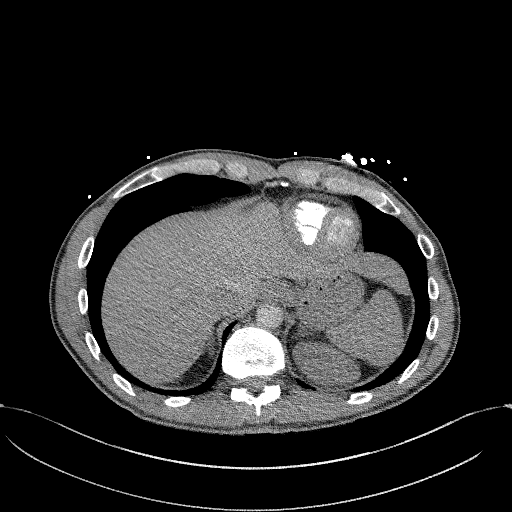
[im 124/371  lung]
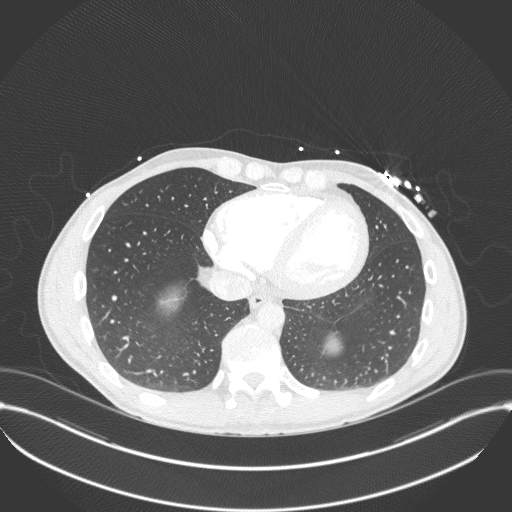
[im 149/371  soft-tissue]
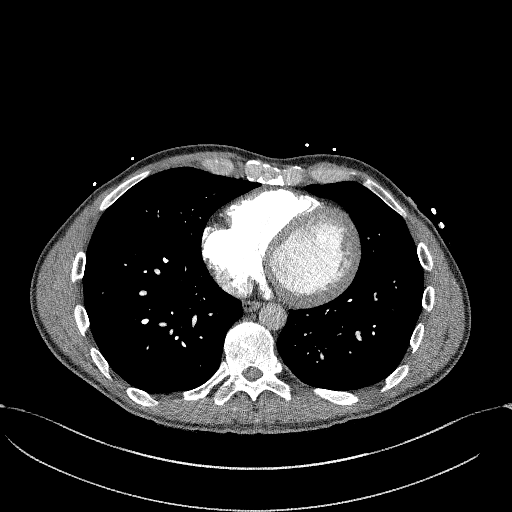
[im 173/371  lung]
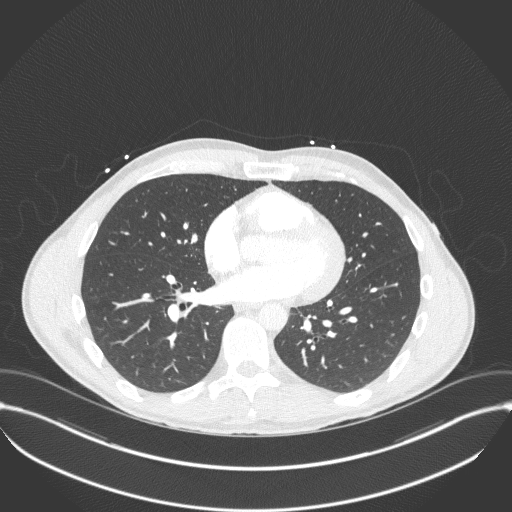
[im 198/371  soft-tissue]
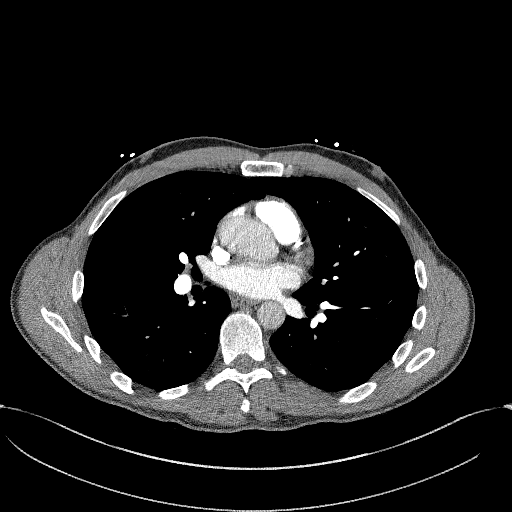
[im 223/371  lung]
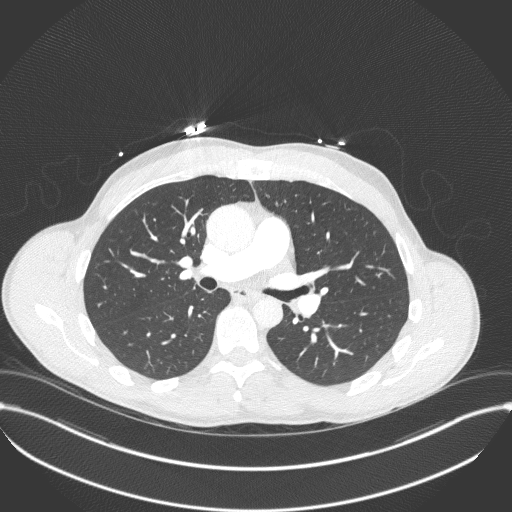
[im 247/371  soft-tissue]
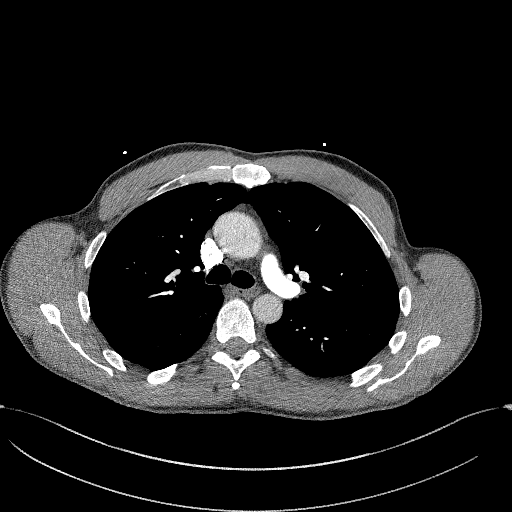
[im 272/371  lung]
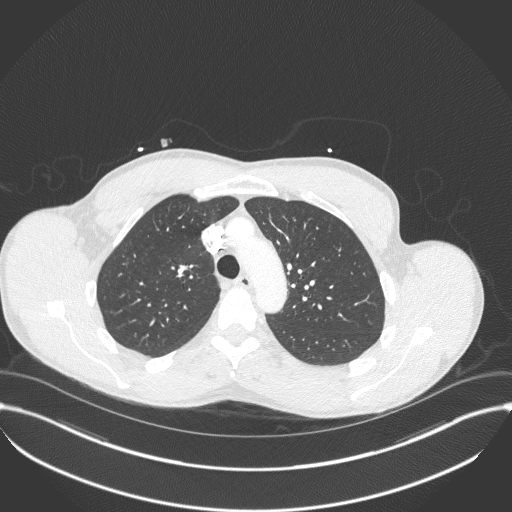
[im 297/371  soft-tissue]
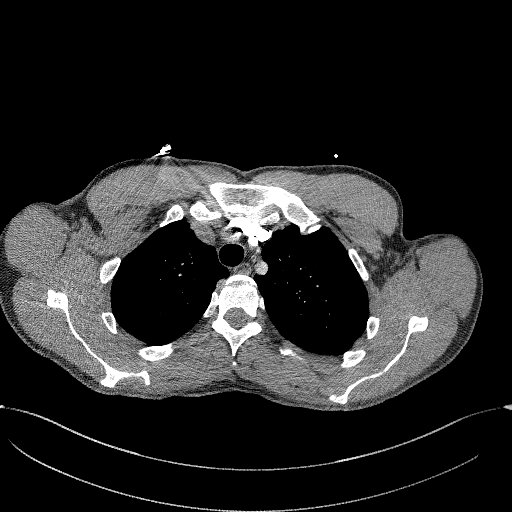
[im 321/371  lung]
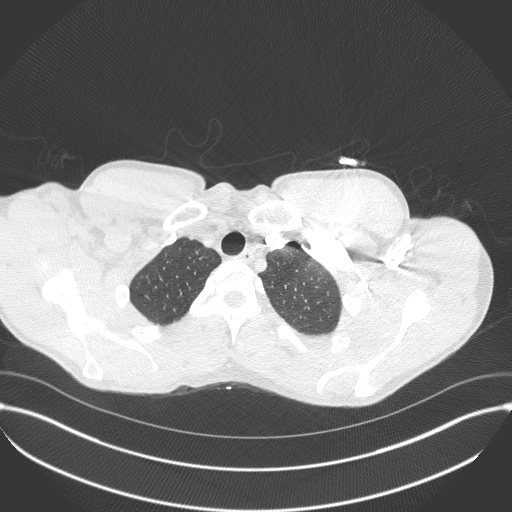
[im 346/371  soft-tissue]
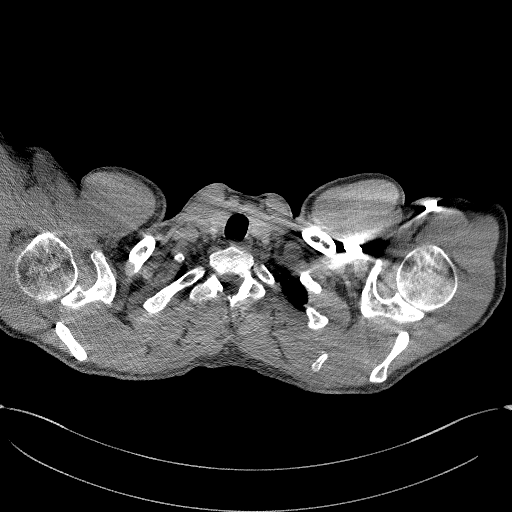

[Series 7: cor soft · coronal · 0.78mm/px · 3 of 141 slices shown]
[im 36/141  soft-tissue]
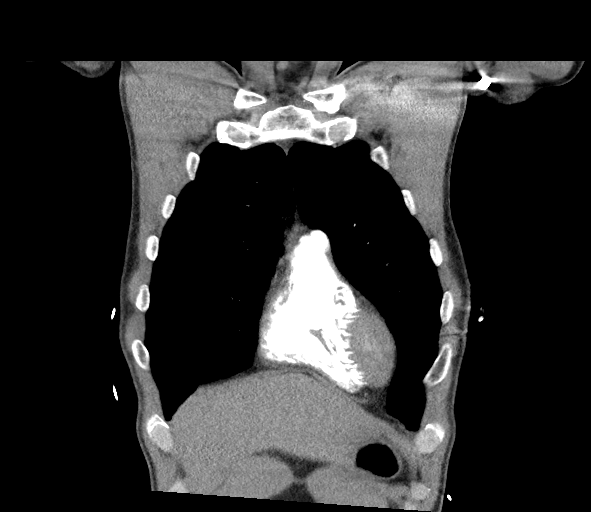
[im 71/141  soft-tissue]
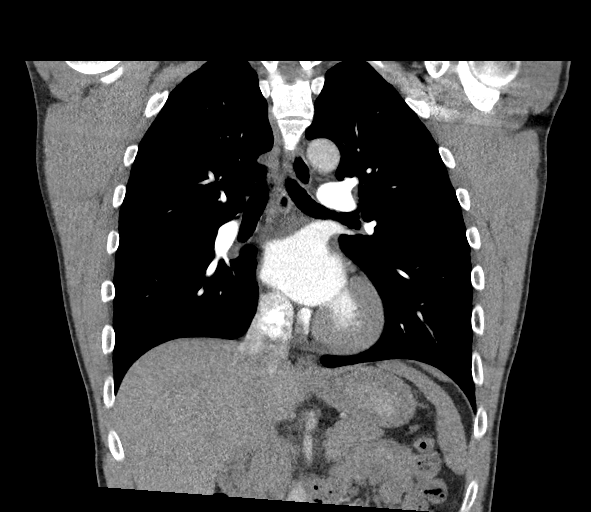
[im 106/141  soft-tissue]
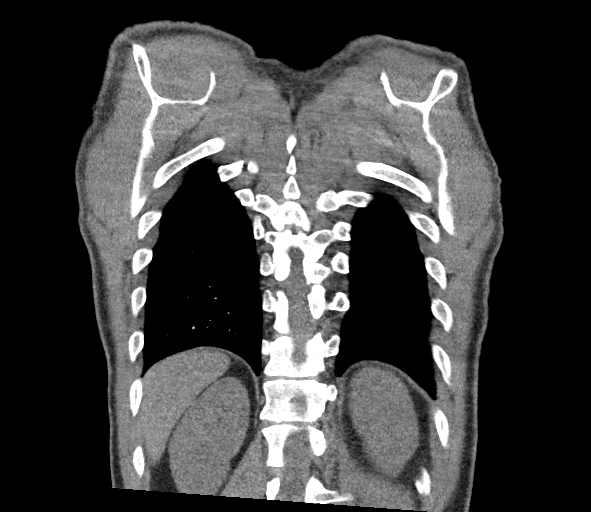

[17 of 46 positions shown; findings below may reference images not displayed]

RADIATION DOSE REDUCTION: This exam was performed according to the
departmental dose-optimization program which includes automated
exposure control, adjustment of the mA and/or kV according to
patient size and/or use of iterative reconstruction technique.

CONTRAST:  74mL OMNIPAQUE IOHEXOL 350 MG/ML SOLN
FINDINGS: Cardiovascular: The cardiac size is normal. There is a small
pericardial effusion anteriorly. Pulmonary arteries are normal in
caliber without evidence of thromboemboli. No aortic dissection or
plaques are seen. There is borderline aneurysmal prominence of the
aortic root which measures 3.9 cm, with ectasia in the ascending
segment which measures 3.7 cm.

The remaining aorta is within normal caliber limits. The great
vessels are unremarkable with normal variant brachiobicarotid trunk.
The pulmonary veins are normal caliber. No findings of acute right
heart strain.

Mediastinum/Nodes: No enlarged mediastinal, hilar, or axillary lymph
nodes. The mid and lower poles of the thyroid gland, trachea, and
esophagus demonstrate no significant findings.

Lungs/Pleura: Lungs are clear. No pleural effusion or pneumothorax.
Central airways are clear.

Upper Abdomen: No acute abnormality.

Musculoskeletal: No chest wall abnormality. No acute or significant
osseous findings.

Review of the MIP images confirms the above findings.
IMPRESSION: 1. No acute chest CT or CTA abnormality.
2. No visible aortic plaques, but with 3.9 cm prominence of the
aortic root, mildly ectatic ascending segment 3.7 cm.
